# Patient Record
Sex: Female | Born: 1995 | Hispanic: Yes | Marital: Single | Smoking: Former smoker
Health system: Southern US, Community
[De-identification: ages and names within clinical notes are randomized; demographics above are authoritative.]

## PROBLEM LIST (undated history)

## (undated) ENCOUNTER — Inpatient Hospital Stay (HOSPITAL_COMMUNITY): Payer: Self-pay

## (undated) DIAGNOSIS — E119 Type 2 diabetes mellitus without complications: Secondary | ICD-10-CM

## (undated) DIAGNOSIS — A599 Trichomoniasis, unspecified: Secondary | ICD-10-CM

## (undated) DIAGNOSIS — O24419 Gestational diabetes mellitus in pregnancy, unspecified control: Secondary | ICD-10-CM

## (undated) HISTORY — DX: Type 2 diabetes mellitus without complications: E11.9

## (undated) HISTORY — DX: Gestational diabetes mellitus in pregnancy, unspecified control: O24.419

## (undated) HISTORY — PX: NO PAST SURGERIES: SHX2092

---
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
## 2017-01-26 NOTE — L&D Delivery Note (Signed)
Obstetrical Delivery Note   Date of Delivery:   10/03/2017 Primary OB:   [facility] Gestational Age/EDD: [redacted]w[redacted]d  Reason for Admission: IOL for GDMa1 Antepartum complications: GDMa1  Delivered By:   Cornelia Copa MD  Delivery Type:   forceps, low  Delivery Details:   CTSP for severe variable decelerations with contractions. Patient was complete and when I checked her was +2 to +3 for the BPD and felt to LOA with adequate pelvic room and EFW 3300gm. Aminoinfusion already going and had received some terbutaline and the pitocin was turned off but was still contracting frequently with the severe decelerations. Risks, benefits discussed with mom and dad and they were amenable to forceps assistance. Anesthesia bolused her epidural, with peds already present in the room, and Luikart-Simpsons were applied without issue. With next contraction, patient pushed but had issues with pain control but with eventual delivery of the fetal head, which was actually LOP The forceps were disarticulated and the rest of the delivery was normal. The cord was immediately cut and clamped and handed to the pediatricians. No bruising, cuts noted on the newborn. Arterial and venous cord gases were sent but only arterial run: Results for LAQUISTA, SHORB (MRN [id number]) as of 10/04/2017 00:53  Ref. Range 10/03/2017 19:05  pH cord blood (arterial) Latest Ref Range: 7.210 - 7.380  7.294  pCO2 cord blood (arterial) Latest Ref Range: 42.0 - 56.0 mmHg 44.6  Bicarbonate Latest Ref Range: 13.0 - 22.0 mmol/L 21.0   Anesthesia:    epidural Intrapartum complications: severe decelerations late in the 1st and during the 2nd stage of labor GBS:    Positive and she received at least two doses of PCN Laceration:    2nd degree; fixed in the usual fashion with 2-0 vicryl Episiotomy:    none Rectal exam:   Before and after repair Placenta:    Delivered and expressed via active  management. Intact: yes. To pathology: yes.  Delayed Cord Clamping: no Estimated Blood Loss:  . Due to atony. Cytotec PR was placed at the end of the procedure and a dose of Lysteda IV was ordered  Baby:    Liveborn female, APGARs 8/9, weight 2991gm  Cornelia Copa. MD Attending [facility])

## 2017-02-16 ENCOUNTER — Encounter: Payer: Self-pay | Admitting: *Deleted

## 2017-02-16 ENCOUNTER — Encounter: Payer: Self-pay | Admitting: Family Medicine

## 2017-02-16 ENCOUNTER — Ambulatory Visit (INDEPENDENT_AMBULATORY_CARE_PROVIDER_SITE_OTHER): Payer: Medicaid Other | Admitting: *Deleted

## 2017-02-16 DIAGNOSIS — Z3200 Encounter for pregnancy test, result unknown: Secondary | ICD-10-CM

## 2017-02-16 DIAGNOSIS — Z3201 Encounter for pregnancy test, result positive: Secondary | ICD-10-CM

## 2017-02-16 LAB — POCT PREGNANCY, URINE: PREG TEST UR: POSITIVE — AB

## 2017-02-16 NOTE — Progress Notes (Signed)
Chart reviewed for nurse visit. Agree with plan of care.   Judeth HornLawrence, Jamisyn Langer, NP 02/16/2017 12:23 PM

## 2017-02-16 NOTE — Progress Notes (Signed)
Here for pregnancy test which was positive. States LMP 12/26/16 which makes her be 7wk 3 day.States periods are regular and sure of that date.  Not sure where she will go for care. Reviewed meds with her, not taking any. Given letter for proof of pregnancy.

## 2017-03-12 ENCOUNTER — Other Ambulatory Visit (HOSPITAL_COMMUNITY)
Admission: RE | Admit: 2017-03-12 | Discharge: 2017-03-12 | Disposition: A | Discharge status: Home / Self Care | Payer: Medicaid Other | Source: Ambulatory Visit | Attending: Student | Admitting: Student

## 2017-03-12 ENCOUNTER — Encounter: Payer: Self-pay | Admitting: Student

## 2017-03-12 ENCOUNTER — Other Ambulatory Visit: Payer: Self-pay

## 2017-03-12 ENCOUNTER — Ambulatory Visit (INDEPENDENT_AMBULATORY_CARE_PROVIDER_SITE_OTHER): Payer: Medicaid Other | Admitting: Student

## 2017-03-12 VITALS — BP 116/61 | HR 72 | Wt 169.6 lb

## 2017-03-12 DIAGNOSIS — O0990 Supervision of high risk pregnancy, unspecified, unspecified trimester: Secondary | ICD-10-CM

## 2017-03-12 DIAGNOSIS — Z3401 Encounter for supervision of normal first pregnancy, first trimester: Secondary | ICD-10-CM

## 2017-03-12 DIAGNOSIS — Z3400 Encounter for supervision of normal first pregnancy, unspecified trimester: Secondary | ICD-10-CM | POA: Diagnosis present

## 2017-03-12 HISTORY — DX: Supervision of high risk pregnancy, unspecified, unspecified trimester: O09.90

## 2017-03-12 LAB — POCT URINALYSIS DIP (DEVICE)
Bilirubin Urine: NEGATIVE
Glucose, UA: NEGATIVE mg/dL
Ketones, ur: NEGATIVE mg/dL
Nitrite: NEGATIVE
PH: 7 (ref 5.0–8.0)
PROTEIN: 30 mg/dL — AB
Specific Gravity, Urine: 1.02 (ref 1.005–1.030)
Urobilinogen, UA: 0.2 mg/dL (ref 0.0–1.0)

## 2017-03-12 MED ORDER — PRENATAL VITAMINS 0.8 MG PO TABS: 1.0000 | ORAL_TABLET | Freq: Every day | ORAL | 12 refills | Status: DC

## 2017-03-12 NOTE — Patient Instructions (Signed)
Eating Plan for Pregnant Women While you are pregnant, your body will require additional nutrition to help support your growing baby. It is recommended that you consume:  150 additional calories each day during your first trimester.  300 additional calories each day during your second trimester.  300 additional calories each day during your third trimester.  Eating a healthy, well-balanced diet is very important for your health and for your baby's health. You also have a higher need for some vitamins and minerals, such as folic acid, calcium, iron, and vitamin D. What do I need to know about eating during pregnancy?  Do not try to lose weight or go on a diet during pregnancy.  Choose healthy, nutritious foods. Choose  of a sandwich with a glass of milk instead of a candy bar or a high-calorie sugar-sweetened beverage.  Limit your overall intake of foods that have "empty calories." These are foods that have little nutritional value, such as sweets, desserts, candies, sugar-sweetened beverages, and fried foods.  Eat a variety of foods, especially fruits and vegetables.  Take a prenatal vitamin to help meet the additional needs during pregnancy, specifically for folic acid, iron, calcium, and vitamin D.  Remember to stay active. Ask your health care provider for exercise recommendations that are specific to you.  Practice good food safety and cleanliness, such as washing your hands before you eat and after you prepare raw meat. This helps to prevent foodborne illnesses, such as listeriosis, that can be very dangerous for your baby. Ask your health care provider for more information about listeriosis. What does 150 extra calories look like? Healthy options for an additional 150 calories each day could be any of the following:  Plain low-fat yogurt (6-8 oz) with  cup of berries.  1 apple with 2 teaspoons of peanut butter.  Cut-up vegetables with  cup of hummus.  Low-fat chocolate  milk (8 oz or 1 cup).  1 string cheese with 1 medium orange.   of a peanut butter and jelly sandwich on whole-wheat bread (1 tsp of peanut butter).  For 300 calories, you could eat two of those healthy options each day. What is a healthy amount of weight to gain? The recommended amount of weight for you to gain is based on your pre-pregnancy BMI. If your pre-pregnancy BMI was:  Less than 18 (underweight), you should gain 28-40 lb.  18-24.9 (normal), you should gain 25-35 lb.  25-29.9 (overweight), you should gain 15-25 lb.  Greater than 30 (obese), you should gain 11-20 lb.  What if I am having twins or multiples? Generally, pregnant women who will be having twins or multiples may need to increase their daily calories by 300-600 calories each day. The recommended range for total weight gain is 25-54 lb, depending on your pre-pregnancy BMI. Talk with your health care provider for specific guidance about additional nutritional needs, weight gain, and exercise during your pregnancy. What foods can I eat? Grains Any grains. Try to choose whole grains, such as whole-wheat bread, oatmeal, or brown rice. Vegetables Any vegetables. Try to eat a variety of colors and types of vegetables to get a full range of vitamins and minerals. Remember to wash your vegetables well before eating. Fruits Any fruits. Try to eat a variety of colors and types of fruit to get a full range of vitamins and minerals. Remember to wash your fruits well before eating. Meats and Other Protein Sources Lean meats, including chicken, turkey, fish, and lean cuts of beef, veal,   or pork. Make sure that all meats are cooked to "well done." Tofu. Tempeh. Beans. Eggs. Peanut butter and other nut butters. Seafood, such as shrimp, crab, and lobster. If you choose fish, select types that are higher in omega-3 fatty acids, including salmon, herring, mussels, trout, sardines, and pollock. Make sure that all meats are cooked to  food-safe temperatures. Dairy Pasteurized milk and milk alternatives. Pasteurized yogurt and pasteurized cheese. Cottage cheese. Sour cream. Beverages Water. Juices that contain 100% fruit juice or vegetable juice. Caffeine-free teas and decaffeinated coffee. Drinks that contain caffeine are okay to drink, but it is better to avoid caffeine. Keep your total caffeine intake to less than 200 mg each day (12 oz of coffee, tea, or soda) or as directed by your health care provider. Condiments Any pasteurized condiments. Sweets and Desserts Any sweets and desserts. Fats and Oils Any fats and oils. The items listed above may not be a complete list of recommended foods or beverages. Contact your dietitian for more options. What foods are not recommended? Vegetables Unpasteurized (raw) vegetable juices. Fruits Unpasteurized (raw) fruit juices. Meats and Other Protein Sources Cured meats that have nitrates, such as bacon, salami, and hotdogs. Luncheon meats, bologna, or other deli meats (unless they are reheated until they are steaming hot). Refrigerated pate, meat spreads from a meat counter, smoked seafood that is found in the refrigerated section of a store. Raw fish, such as sushi or sashimi. High mercury content fish, such as tilefish, shark, swordfish, and king mackerel. Raw meats, such as tuna or beef tartare. Undercooked meats and poultry. Make sure that all meats are cooked to food-safe temperatures. Dairy Unpasteurized (raw) milk and any foods that have raw milk in them. Soft cheeses, such as feta, queso blanco, queso fresco, Brie, Camembert cheeses, blue-veined cheeses, and Panela cheese (unless it is made with pasteurized milk, which must be stated on the label). Beverages Alcohol. Sugar-sweetened beverages, such as sodas, teas, or energy drinks. Condiments Homemade fermented foods and drinks, such as pickles, sauerkraut, or kombucha drinks. (Store-bought pasteurized versions of these are  okay.) Other Salads that are made in the store, such as ham salad, chicken salad, egg salad, tuna salad, and seafood salad. The items listed above may not be a complete list of foods and beverages to avoid. Contact your dietitian for more information. This information is not intended to replace advice given to you by your health care provider. Make sure you discuss any questions you have with your health care provider. Document Released: 10/27/2013 Document Revised: 06/20/2015 Document Reviewed: 06/27/2013 Elsevier Interactive Patient Education  2018 Elsevier Inc.   

## 2017-03-12 NOTE — Progress Notes (Signed)
Babyscripts optimized

## 2017-03-13 NOTE — Progress Notes (Signed)
  Subjective:    Tamara Lowery is being seen today for her first obstetrical visit.  This is not a planned pregnancy. She is at 2020w0d gestation. Her obstetrical history is significant for nothing. . Relationship with FOB: significant other, not living together. Patient does intend to breast feed. Pregnancy history fully reviewed.  Patient reports no complaints.  Review of Systems:   Review of Systems  Constitutional: Negative.   HENT: Negative.   Respiratory: Negative.   Cardiovascular: Negative.   Gastrointestinal: Negative.   Genitourinary: Negative.   Musculoskeletal: Negative.   Neurological: Negative.   Hematological: Negative.   Psychiatric/Behavioral: Negative.     Objective:     BP 116/61   Pulse 72   Wt 169 lb 9.6 oz (76.9 kg)   LMP 12/26/2016 (Exact Date)   BMI 26.56 kg/m  Physical Exam  Constitutional: She is oriented to person, place, and time. She appears well-developed.  HENT:  Head: Normocephalic.  Neck: Normal range of motion.  Respiratory: Effort normal.  GI: Soft.  Musculoskeletal: Normal range of motion.  Neurological: She is alert and oriented to person, place, and time.  Skin: Skin is warm and dry.  Psychiatric: She has a normal mood and affect.    Exam    Assessment:    Pregnancy: G1P0 Patient Active Problem List   Diagnosis Date Noted  . Supervision of normal first pregnancy, antepartum 03/12/2017       Plan:     Initial labs drawn. Prenatal vitamins. Problem list reviewed and updated. AFP3 discussed: will order quad.  and 1st trimester screen.  Role of ultrasound in pregnancy discussed; fetal survey: will order at next visit. . Amniocentesis discussed: not indicated. Follow up in 2 weeks. 50% of 30 min visit spent on counseling and coordination of care.  [facility] -[facility] not needed   Charlesetta GaribaldiKathryn Lorraine [facility] HospitalKooistra 03/13/2017

## 2017-03-15 ENCOUNTER — Other Ambulatory Visit: Payer: Self-pay | Admitting: Student

## 2017-03-15 LAB — CERVICOVAGINAL ANCILLARY ONLY
Bacterial vaginitis: POSITIVE — AB
CANDIDA VAGINITIS: NEGATIVE
CHLAMYDIA, DNA PROBE: NEGATIVE
Neisseria Gonorrhea: NEGATIVE
TRICH (WINDOWPATH): NEGATIVE

## 2017-03-15 MED ORDER — METRONIDAZOLE 500 MG PO TABS: 500.0000 mg | ORAL_TABLET | Freq: Two times a day (BID) | ORAL | 0 refills | Status: DC

## 2017-03-17 ENCOUNTER — Encounter: Payer: Self-pay | Admitting: *Deleted

## 2017-03-18 ENCOUNTER — Telehealth: Payer: Self-pay | Admitting: *Deleted

## 2017-03-18 ENCOUNTER — Telehealth: Payer: Self-pay | Admitting: Student

## 2017-03-18 NOTE — Telephone Encounter (Signed)
-----   Message from Marylene LandKathryn Lorraine Kooistra, CNM sent at 03/18/2017  5:06 PM EST ----- Please call this patient and let her know about BV and flagyl waiting for her at the pharmacy. I tried to call two times today; no answer. Left one vm on cell phone. Thank you!

## 2017-03-18 NOTE — Telephone Encounter (Signed)
I called Tamara Lowery and notified her of BV and rx sent to pharmacy. She voices understanding.

## 2017-03-18 NOTE — Telephone Encounter (Signed)
Tried to call patient again; no answer at home phone and no answer on cell phone. DId not leave a message. Will send message to [facility] Clinical pool to follow up.  Luna KitchensKathryn Jaquia Benedicto

## 2017-03-18 NOTE — Telephone Encounter (Signed)
Left vmail for patient in Spanish asking her to call clinic to talk about her results. Will try again later today.   Luna KitchensKathryn kooistra

## 2017-03-19 LAB — OBSTETRIC PANEL, INCLUDING HIV
Antibody Screen: NEGATIVE
BASOS ABS: 0 10*3/uL (ref 0.0–0.2)
Basos: 0 %
EOS (ABSOLUTE): 0.1 10*3/uL (ref 0.0–0.4)
Eos: 1 %
HIV Screen 4th Generation wRfx: NONREACTIVE
Hematocrit: 36.8 % (ref 34.0–46.6)
Hemoglobin: 11.9 g/dL (ref 11.1–15.9)
Hepatitis B Surface Ag: NEGATIVE
Immature Grans (Abs): 0 10*3/uL (ref 0.0–0.1)
Immature Granulocytes: 0 %
LYMPHS ABS: 2.1 10*3/uL (ref 0.7–3.1)
Lymphs: 26 %
MCH: 31.5 pg (ref 26.6–33.0)
MCHC: 32.3 g/dL (ref 31.5–35.7)
MCV: 97 fL (ref 79–97)
MONOCYTES: 6 %
Monocytes Absolute: 0.5 10*3/uL (ref 0.1–0.9)
NEUTROS ABS: 5.3 10*3/uL (ref 1.4–7.0)
Neutrophils: 67 %
PLATELETS: 335 10*3/uL (ref 150–379)
RBC: 3.78 x10E6/uL (ref 3.77–5.28)
RDW: 13.2 % (ref 12.3–15.4)
RPR Ser Ql: NONREACTIVE
Rh Factor: POSITIVE
Rubella Antibodies, IGG: 2.59 index (ref >0.99)
WBC: 8.1 10*3/uL (ref 3.4–10.8)

## 2017-03-19 LAB — HEMOGLOBINOPATHY EVALUATION
Ferritin: 152 ng/mL — ABNORMAL HIGH (ref 15–150)
Hgb A2 Quant: 1.7 % — ABNORMAL LOW (ref 1.8–3.2)
Hgb A: 98.3 % (ref 96.4–98.8)
Hgb C: 0 %
Hgb F Quant: 0 % (ref 0.0–2.0)
Hgb S: 0 %
Hgb Solubility: NEGATIVE
Hgb Variant: 0 %

## 2017-03-19 LAB — CYSTIC FIBROSIS GENE TEST

## 2017-03-20 LAB — URINE CULTURE, OB REFLEX

## 2017-03-20 LAB — CULTURE, OB URINE

## 2017-03-22 LAB — SMN1 COPY NUMBER ANALYSIS (SMA CARRIER SCREENING)

## 2017-03-24 ENCOUNTER — Encounter: Payer: Self-pay | Admitting: Student

## 2017-03-24 DIAGNOSIS — D649 Anemia, unspecified: Secondary | ICD-10-CM | POA: Insufficient documentation

## 2017-03-24 HISTORY — DX: Anemia, unspecified: D64.9

## 2017-03-26 ENCOUNTER — Encounter: Payer: Medicaid Other | Admitting: Obstetrics and Gynecology

## 2017-03-29 ENCOUNTER — Other Ambulatory Visit: Payer: Self-pay | Admitting: Student

## 2017-03-29 ENCOUNTER — Encounter (HOSPITAL_COMMUNITY): Payer: Self-pay

## 2017-03-29 ENCOUNTER — Ambulatory Visit (HOSPITAL_COMMUNITY)
Admission: RE | Admit: 2017-03-29 | Discharge: 2017-03-29 | Disposition: A | Discharge status: Home / Self Care | Payer: Medicaid Other | Source: Ambulatory Visit | Attending: Student | Admitting: Student

## 2017-03-29 DIAGNOSIS — Z3400 Encounter for supervision of normal first pregnancy, unspecified trimester: Secondary | ICD-10-CM

## 2017-03-29 DIAGNOSIS — Z3A13 13 weeks gestation of pregnancy: Secondary | ICD-10-CM | POA: Diagnosis not present

## 2017-03-29 DIAGNOSIS — Z3682 Encounter for antenatal screening for nuchal translucency: Secondary | ICD-10-CM

## 2017-03-29 HISTORY — DX: Trichomoniasis, unspecified: A59.9

## 2017-03-29 NOTE — Addendum Note (Signed)
Encounter addended by: Lenise ArenaBazemore, Mercades Bajaj, RDMS on: 03/29/2017 10:04 AM  Actions taken: Imaging Exam ended

## 2017-04-01 ENCOUNTER — Other Ambulatory Visit: Payer: Self-pay

## 2017-04-13 ENCOUNTER — Encounter: Payer: Medicaid Other | Admitting: Family Medicine

## 2017-04-14 ENCOUNTER — Ambulatory Visit (INDEPENDENT_AMBULATORY_CARE_PROVIDER_SITE_OTHER): Payer: Medicaid Other | Admitting: Family Medicine

## 2017-04-14 VITALS — BP 126/79 | HR 83 | Wt 173.5 lb

## 2017-04-14 DIAGNOSIS — D649 Anemia, unspecified: Secondary | ICD-10-CM

## 2017-04-14 DIAGNOSIS — Z3689 Encounter for other specified antenatal screening: Secondary | ICD-10-CM

## 2017-04-14 DIAGNOSIS — Z3400 Encounter for supervision of normal first pregnancy, unspecified trimester: Secondary | ICD-10-CM

## 2017-04-14 NOTE — Progress Notes (Signed)
   PRENATAL VISIT NOTE  Subjective:  Tamara Lowery is a 22 y.o. G1P0 at 5164w4d being seen today for ongoing prenatal care.  She is currently monitored for the following issues for this low-risk pregnancy and has Supervision of normal first pregnancy, antepartum and Low blood hemoglobin A2 on their problem list.  Patient reports no complaints.   . Vag. Bleeding: None.  Movement: Present. Denies leaking of fluid.   The following portions of the patient's history were reviewed and updated as appropriate: allergies, current medications, past family history, past medical history, past social history, past surgical history and problem list. Problem list updated.  Objective:   Vitals:   04/14/17 1508  BP: 126/79  Pulse: 83  Weight: 173 lb 8 oz (78.7 kg)    Fetal Status: Fetal Heart Rate (bpm): 142   Movement: Present     General:  Alert, oriented and cooperative. Patient is in no acute distress.  Skin: Skin is warm and dry. No rash noted.   Cardiovascular: Normal heart rate noted  Respiratory: Normal respiratory effort, no problems with respiration noted  Abdomen: Soft, gravid, appropriate for gestational age.  Pain/Pressure: Absent     Pelvic: Cervical exam deferred        Extremities: Normal range of motion.  Edema: None  Mental Status:  Normal mood and affect. Normal behavior. Normal judgment and thought content.   Assessment and Plan:  Pregnancy: G1P0 at 2564w4d   1. Screening, antenatal, for fetal anatomic survey  - US MFM OB DETAIL +14 WK; Future  2. Supervision of normal first pregnancy, antepartum  3. Low blood hemoglobin A2 Order panorama   Please refer to After Visit Summary for other counseling recommendations.  Return in about 4 weeks (around 05/12/2017).   Rolm BookbinderAmber Jobe Mutch, DO

## 2017-04-20 ENCOUNTER — Encounter: Payer: Self-pay | Admitting: *Deleted

## 2017-05-06 ENCOUNTER — Other Ambulatory Visit: Payer: Self-pay | Admitting: Family Medicine

## 2017-05-06 ENCOUNTER — Ambulatory Visit (HOSPITAL_COMMUNITY)
Admission: RE | Admit: 2017-05-06 | Discharge: 2017-05-06 | Disposition: A | Discharge status: Home / Self Care | Payer: Medicaid Other | Source: Ambulatory Visit | Attending: Family Medicine | Admitting: Family Medicine

## 2017-05-06 DIAGNOSIS — Z3A18 18 weeks gestation of pregnancy: Secondary | ICD-10-CM

## 2017-05-06 DIAGNOSIS — Z363 Encounter for antenatal screening for malformations: Secondary | ICD-10-CM | POA: Insufficient documentation

## 2017-05-06 DIAGNOSIS — Z3689 Encounter for other specified antenatal screening: Secondary | ICD-10-CM

## 2017-05-06 DIAGNOSIS — D649 Anemia, unspecified: Secondary | ICD-10-CM

## 2017-05-06 DIAGNOSIS — O2692 Pregnancy related conditions, unspecified, second trimester: Secondary | ICD-10-CM

## 2017-05-12 ENCOUNTER — Ambulatory Visit (INDEPENDENT_AMBULATORY_CARE_PROVIDER_SITE_OTHER): Payer: Medicaid Other | Admitting: Family Medicine

## 2017-05-12 VITALS — BP 106/60 | HR 88 | Wt 189.0 lb

## 2017-05-12 DIAGNOSIS — Z3400 Encounter for supervision of normal first pregnancy, unspecified trimester: Secondary | ICD-10-CM

## 2017-05-12 DIAGNOSIS — Z3402 Encounter for supervision of normal first pregnancy, second trimester: Secondary | ICD-10-CM

## 2017-05-12 DIAGNOSIS — D649 Anemia, unspecified: Secondary | ICD-10-CM

## 2017-05-12 NOTE — Progress Notes (Signed)
   PRENATAL VISIT NOTE  Subjective:  Tamara Lowery is a 22 y.o. G1P0 at 8144w4d being seen today for ongoing prenatal care.  She is currently monitored for the following issues for this low-risk pregnancy and has Supervision of normal first pregnancy, antepartum and Low blood hemoglobin A2 on their problem list.  Patient reports no complaints.  Contractions: Not present. Vag. Bleeding: None.  Movement: Present. Denies leaking of fluid.   The following portions of the patient's history were reviewed and updated as appropriate: allergies, current medications, past family history, past medical history, past social history, past surgical history and problem list. Problem list updated.  Objective:   Vitals:   05/12/17 1401  BP: 106/60  Pulse: 88  Weight: 189 lb (85.7 kg)    Fetal Status: Fetal Heart Rate (bpm): 141   Movement: Present     General:  Alert, oriented and cooperative. Patient is in no acute distress.  Skin: Skin is warm and dry. No rash noted.   Cardiovascular: Normal heart rate noted  Respiratory: Normal respiratory effort, no problems with respiration noted  Abdomen: Soft, gravid, appropriate for gestational age.  Pain/Pressure: Absent     Pelvic: Cervical exam deferred        Extremities: Normal range of motion.     Mental Status: Normal mood and affect. Normal behavior. Normal judgment and thought content.   Assessment and Plan:  Pregnancy: G1P0 at 3844w4d  1. Supervision of normal first pregnancy, antepartum - Routine prenatal care - Schedule follow up US to complete anatomy scan - [place] MFM OB FOLLOW UP; Future  Preterm labor symptoms and general obstetric precautions including but not limited to vaginal bleeding, contractions, leaking of fluid and fetal movement were reviewed in detail with the patient. Please refer to After Visit Summary for other counseling recommendations.  Return in about 1 month (around 06/09/2017) for LOB.  Future Appointments  Date  Time Provider Department Center  06/02/2017  2:45 PM [facility] US 5 [facility]-US  06/09/2017  1:15 PM Rolm BookbinderNeill, Caroline M, CNM [facility]  07/14/2017  8:20 AM [facility] LAB [facility]  07/14/2017  8:55 AM Judeth HornLawrence, Erin, NP [facility]    Frederik PearJulie P Furman Trentman, MD

## 2017-05-12 NOTE — Patient Instructions (Signed)
Second Trimester of Pregnancy The second trimester is from week 13 through week 28, month 4 through 6. This is often the time in pregnancy that you feel your best. Often times, morning sickness has lessened or quit. You may have more energy, and you may get hungry more often. Your unborn baby (fetus) is growing rapidly. At the end of the sixth month, he or she is about 9 inches long and weighs about 1 pounds. You will likely feel the baby move (quickening) between 18 and 20 weeks of pregnancy. Follow these instructions at home:  Avoid all smoking, herbs, and alcohol. Avoid drugs not approved by your doctor.  Do not use any tobacco products, including cigarettes, chewing tobacco, and electronic cigarettes. If you need help quitting, ask your doctor. You may get counseling or other support to help you quit.  Only take medicine as told by your doctor. Some medicines are safe and some are not during pregnancy.  Exercise only as told by your doctor. Stop exercising if you start having cramps.  Eat regular, healthy meals.  Wear a good support bra if your breasts are tender.  Do not use hot tubs, steam rooms, or saunas.  Wear your seat belt when driving.  Avoid raw meat, uncooked cheese, and liter boxes and soil used by cats.  Take your prenatal vitamins.  Take 1500-2000 milligrams of calcium daily starting at the 20th week of pregnancy until you deliver your baby.  Try taking medicine that helps you poop (stool softener) as needed, and if your doctor approves. Eat more fiber by eating fresh fruit, vegetables, and whole grains. Drink enough fluids to keep your pee (urine) clear or pale yellow.  Take warm water baths (sitz baths) to soothe pain or discomfort caused by hemorrhoids. Use hemorrhoid cream if your doctor approves.  If you have puffy, bulging veins (varicose veins), wear support hose. Raise (elevate) your feet for 15 minutes, 3-4 times a day. Limit salt in your diet.  Avoid heavy  lifting, wear low heals, and sit up straight.  Rest with your legs raised if you have leg cramps or low back pain.  Visit your dentist if you have not gone during your pregnancy. Use a soft toothbrush to brush your teeth. Be gentle when you floss.  You can have sex (intercourse) unless your doctor tells you not to.  Go to your doctor visits. Get help if:  You feel dizzy.  You have mild cramps or pressure in your lower belly (abdomen).  You have a nagging pain in your belly area.  You continue to feel sick to your stomach (nauseous), throw up (vomit), or have watery poop (diarrhea).  You have bad smelling fluid coming from your vagina.  You have pain with peeing (urination). Get help right away if:  You have a fever.  You are leaking fluid from your vagina.  You have spotting or bleeding from your vagina.  You have severe belly cramping or pain.  You lose or gain weight rapidly.  You have trouble catching your breath and have chest pain.  You notice sudden or extreme puffiness (swelling) of your face, hands, ankles, feet, or legs.  You have not felt the baby move in over an hour.  You have severe headaches that do not go away with medicine.  You have vision changes. This information is not intended to replace advice given to you by your health care provider. Make sure you discuss any questions you have with your health care   provider. Document Released: 04/08/2009 Document Revised: 06/20/2015 Document Reviewed: 03/15/2012 Elsevier Interactive Patient Education  2017 [employer]  

## 2017-06-02 ENCOUNTER — Ambulatory Visit (HOSPITAL_COMMUNITY)
Admission: RE | Admit: 2017-06-02 | Discharge: 2017-06-02 | Disposition: A | Discharge status: Home / Self Care | Payer: Medicaid Other | Source: Ambulatory Visit | Attending: Family Medicine | Admitting: Family Medicine

## 2017-06-02 DIAGNOSIS — Z362 Encounter for other antenatal screening follow-up: Secondary | ICD-10-CM | POA: Insufficient documentation

## 2017-06-02 DIAGNOSIS — Z3A22 22 weeks gestation of pregnancy: Secondary | ICD-10-CM | POA: Diagnosis not present

## 2017-06-02 DIAGNOSIS — O321XX0 Maternal care for breech presentation, not applicable or unspecified: Secondary | ICD-10-CM | POA: Insufficient documentation

## 2017-06-02 DIAGNOSIS — D649 Anemia, unspecified: Secondary | ICD-10-CM | POA: Diagnosis not present

## 2017-06-02 DIAGNOSIS — O99012 Anemia complicating pregnancy, second trimester: Secondary | ICD-10-CM | POA: Insufficient documentation

## 2017-06-02 DIAGNOSIS — Z3400 Encounter for supervision of normal first pregnancy, unspecified trimester: Secondary | ICD-10-CM

## 2017-06-09 ENCOUNTER — Ambulatory Visit (INDEPENDENT_AMBULATORY_CARE_PROVIDER_SITE_OTHER): Payer: Medicaid Other

## 2017-06-09 VITALS — BP 115/69 | HR 84 | Wt 207.8 lb

## 2017-06-09 DIAGNOSIS — Z3400 Encounter for supervision of normal first pregnancy, unspecified trimester: Secondary | ICD-10-CM

## 2017-06-09 NOTE — Progress Notes (Signed)
   PRENATAL VISIT NOTE  Subjective:  Tamara Lowery is a 22 y.o. G1P0 at [redacted]w[redacted]d being seen today for ongoing prenatal care.  She is currently monitored for the following issues for this low-risk pregnancy and has Supervision of normal first pregnancy, antepartum and Low blood hemoglobin A2 on their problem list.  Patient reports no complaints.  Contractions: Not present. Vag. Bleeding: None.  Movement: Present. Denies leaking of fluid.   The following portions of the patient's history were reviewed and updated as appropriate: allergies, current medications, past family history, past medical history, past social history, past surgical history and problem list. Problem list updated.  Objective:   Vitals:   06/09/17 1316  BP: 115/69  Pulse: 84  Weight: 207 lb 12.8 oz (94.3 kg)    Fetal Status: Fetal Heart Rate (bpm): 138 Fundal Height: 24 cm Movement: Present     General:  Alert, oriented and cooperative. Patient is in no acute distress.  Skin: Skin is warm and dry. No rash noted.   Cardiovascular: Normal heart rate noted  Respiratory: Normal respiratory effort, no problems with respiration noted  Abdomen: Soft, gravid, appropriate for gestational age.  Pain/Pressure: Absent     Pelvic: Cervical exam deferred        Extremities: Normal range of motion.  Edema: Trace  Mental Status: Normal mood and affect. Normal behavior. Normal judgment and thought content.   Assessment and Plan:  Pregnancy: G1P0 at [redacted]w[redacted]d  1. Supervision of normal first pregnancy, antepartum - No complaints. Routine care  - Follow up ultrasound for growth ordered per MFM - [place] MFM OB FOLLOW UP; Future  Preterm labor symptoms and general obstetric precautions including but not limited to vaginal bleeding, contractions, leaking of fluid and fetal movement were reviewed in detail with the patient. Please refer to After Visit Summary for other counseling recommendations.  Return in about 1 month (around  07/07/2017) for 2hr GTT and labs, Return OB visit.  Future Appointments  Date Time Provider Department Center  07/14/2017  8:20 AM [facility] LAB [facility]  07/14/2017  8:55 AM Judeth Horn, NP [facility]  07/14/2017  1:45 PM [facility] [place] 2 [facility] M[facility]-US    Rolm Bookbinder, CNM  06/09/17 1:27 PM

## 2017-06-09 NOTE — Patient Instructions (Signed)
Glucose Tolerance Test During Pregnancy The glucose tolerance test (GTT) is a blood test used to determine if you have developed a type of diabetes during pregnancy (gestational diabetes). This is when your body does not properly process sugar (glucose) in the food you eat, resulting in high blood glucose levels. Typically, a GTT is done after you have had a 1-hour glucose test with results that indicate you possibly have gestational diabetes. It may also be done if:  You have a history of giving birth to very large babies or have experienced repeated fetal loss (stillbirth).  You have signs and symptoms of diabetes, such as: ? Changes in your vision. ? Tingling or numbness in your hands or feet. ? Changes in hunger, thirst, and urination not otherwise explained by your pregnancy.  The GTT lasts about 3 hours. You will be given a sugar-water solution to drink at the beginning of the test. You will have blood drawn before you drink the solution and then again 1, 2, and 3 hours after you drink it. You will not be allowed to eat or drink anything else during the test. You must remain at the testing location to make sure that your blood is drawn on time. You should also avoid exercising during the test, because exercise can alter test results. How do I prepare for this test? Eat normally for 3 days prior to the GTT test, including having plenty of carbohydrate-rich foods. Do not eat or drink anything except water during the final 12 hours before the test. In addition, your health care provider may ask you to stop taking certain medicines before the test. What do the results mean? It is your responsibility to obtain your test results. Ask the lab or department performing the test when and how you will get your results. Contact your health care provider to discuss any questions you have about your results. Range of Normal Values Ranges for normal values may vary among different labs and hospitals. You  should always check with your health care provider after having lab work or other tests done to discuss whether your values are considered within normal limits. Normal levels of blood glucose are as follows:  Fasting: less than 105 mg/dL.  1 hour after drinking the solution: less than 190 mg/dL.  2 hours after drinking the solution: less than 165 mg/dL.  3 hours after drinking the solution: less than 145 mg/dL.  Some substances can interfere with GTT results. These may include:  Blood pressure and heart failure medicines, including beta blockers, furosemide, and thiazides.  Anti-inflammatory medicines, including aspirin.  Nicotine.  Some psychiatric medicines.  Meaning of Results Outside Normal Value Ranges GTT test results that are above normal values may indicate a number of health problems, such as:  Gestational diabetes.  Acute stress response.  Cushing syndrome.  Tumors such as pheochromocytoma or glucagonoma.  Long-term kidney problems.  Pancreatitis.  Hyperthyroidism.  Current infection.  Discuss your test results with your health care provider. He or she will use the results to make a diagnosis and determine a treatment plan that is right for you. This information is not intended to replace advice given to you by your health care provider. Make sure you discuss any questions you have with your health care provider. Document Released: 07/14/2011 Document Revised: 06/20/2015 Document Reviewed: 05/19/2013 Elsevier Interactive Patient Education  2018 [employer]  Safe Medications in Pregnancy   Acne: Benzoyl Peroxide Salicylic Acid  Backache/Headache: Tylenol: 2 regular strength every 4 hours   OR              2 Extra strength every 6 hours  Colds/Coughs/Allergies: Benadryl (alcohol free) 25 mg every 6 hours as needed Breath right strips Claritin Cepacol throat lozenges Chloraseptic throat spray Cold-Eeze- up to three times per day Cough drops,  alcohol free Flonase (by prescription only) Guaifenesin Mucinex Robitussin DM (plain only, alcohol free) Saline nasal spray/drops Sudafed (pseudoephedrine) & Actifed ** use only after [redacted] weeks gestation and if you do not have high blood pressure Tylenol Vicks Vaporub Zinc lozenges Zyrtec   Constipation: Colace Ducolax suppositories Fleet enema Glycerin suppositories Metamucil Milk of magnesia Miralax Senokot Smooth move tea  Diarrhea: Kaopectate Imodium A-D  *NO pepto Bismol  Hemorrhoids: Anusol Anusol HC Preparation H Tucks  Indigestion: Tums Maalox Mylanta Zantac  Pepcid  Insomnia: Benadryl (alcohol free) 25mg every 6 hours as needed Tylenol PM Unisom, no Gelcaps  Leg Cramps: Tums MagGel  Nausea/Vomiting:  Bonine Dramamine Emetrol Ginger extract Sea bands Meclizine  Nausea medication to take during pregnancy:  Unisom (doxylamine succinate 25 mg tablets) Take one tablet daily at bedtime. If symptoms are not adequately controlled, the dose can be increased to a maximum recommended dose of two tablets daily (1/2 tablet in the morning, 1/2 tablet mid-afternoon and one at bedtime). Vitamin B6 100mg tablets. Take one tablet twice a day (up to 200 mg per day).  Skin Rashes: Aveeno products Benadryl cream or 25mg every 6 hours as needed Calamine Lotion 1% cortisone cream  Yeast infection: Gyne-lotrimin 7 Monistat 7   **If taking multiple medications, please check labels to avoid duplicating the same active ingredients **take medication as directed on the label ** Do not exceed 4000 mg of tylenol in 24 hours **Do not take medications that contain aspirin or ibuprofen     

## 2017-07-14 ENCOUNTER — Other Ambulatory Visit: Payer: Medicaid Other

## 2017-07-14 ENCOUNTER — Ambulatory Visit (HOSPITAL_COMMUNITY)
Admission: RE | Admit: 2017-07-14 | Discharge: 2017-07-14 | Disposition: A | Discharge status: Home / Self Care | Payer: Medicaid Other | Source: Ambulatory Visit

## 2017-07-14 ENCOUNTER — Other Ambulatory Visit: Payer: Self-pay

## 2017-07-14 ENCOUNTER — Ambulatory Visit (INDEPENDENT_AMBULATORY_CARE_PROVIDER_SITE_OTHER): Payer: Medicaid Other | Admitting: Nurse Practitioner

## 2017-07-14 VITALS — BP 126/59 | HR 71 | Wt 220.0 lb

## 2017-07-14 DIAGNOSIS — Z362 Encounter for other antenatal screening follow-up: Secondary | ICD-10-CM

## 2017-07-14 DIAGNOSIS — Z3A28 28 weeks gestation of pregnancy: Secondary | ICD-10-CM

## 2017-07-14 DIAGNOSIS — Z3400 Encounter for supervision of normal first pregnancy, unspecified trimester: Secondary | ICD-10-CM

## 2017-07-14 DIAGNOSIS — Z3689 Encounter for other specified antenatal screening: Secondary | ICD-10-CM | POA: Insufficient documentation

## 2017-07-14 DIAGNOSIS — O2690 Pregnancy related conditions, unspecified, unspecified trimester: Secondary | ICD-10-CM

## 2017-07-14 DIAGNOSIS — O99013 Anemia complicating pregnancy, third trimester: Secondary | ICD-10-CM | POA: Insufficient documentation

## 2017-07-14 DIAGNOSIS — O2693 Pregnancy related conditions, unspecified, third trimester: Secondary | ICD-10-CM | POA: Diagnosis not present

## 2017-07-14 DIAGNOSIS — Z23 Encounter for immunization: Secondary | ICD-10-CM

## 2017-07-14 DIAGNOSIS — R635 Abnormal weight gain: Secondary | ICD-10-CM | POA: Insufficient documentation

## 2017-07-14 DIAGNOSIS — O321XX0 Maternal care for breech presentation, not applicable or unspecified: Secondary | ICD-10-CM | POA: Diagnosis not present

## 2017-07-14 DIAGNOSIS — Z3403 Encounter for supervision of normal first pregnancy, third trimester: Secondary | ICD-10-CM

## 2017-07-14 HISTORY — DX: Abnormal weight gain: R63.5

## 2017-07-14 MED ORDER — PRENATA 29-1 MG PO CHEW: 1.0000 | CHEWABLE_TABLET | Freq: Every day | ORAL | 11 refills | Status: DC

## 2017-07-14 NOTE — Progress Notes (Signed)
Patient reports itching of the eyes for the past week- denies allergies

## 2017-07-14 NOTE — Patient Instructions (Addendum)
Allergy eye drops or claritin or zyrtec   AREA PEDIATRIC/FAMILY PRACTICE PHYSICIANS  [facility] [address] [place], [place]  [address] Phone - [phone]   Fax - [phone]  [facility] [address] [place], [place] [address] Phone - [phone]   Fax - [phone]  JACK AMOS [address] [place], [place]  [address] Phone - [phone]   Fax - [phone]  [facility] [address] [place], [place]  [address] Phone - [phone]   Fax - [phone]  [facility] [address] [place], [place]  [address] Phone - [phone]   Fax - [phone]  [facility] [address] [place], [place]  [address] Phone - [phone]   Fax - [phone]  [facility] [address] [place], [place]  [address] Phone - [phone]   Fax - [phone]  [facility] [address] [place], [address] [place], [place]  [address] Phone - [phone]   Fax - [phone]  [facility] AT [facility] [address] [place], [place]  [address] Phone - [phone]   Fax - [phone] [facility] [place] [address] [place], [place]  [address] Phone - [phone]   Fax - [phone]  [facility] [address] [place], [place]  [address] Phone - [phone]   Fax - [phone]  [facility] [address], [address] [place], [place]  [address] Phone - [phone]   Fax - [phone]  [facility] [place] [address] [place], [place]  [address] Phone - [phone]   Fax - [phone]  [place] [facility] [address] [place], [place]  [address] Phone - [phone]  [facility] [address] [place], [place]  [address] Phone - [phone]   Fax -  [phone]  [facility] [address] [place], [place]  [address] Phone - [phone]   Fax - [phone]  [facility] [address] [place], [place]  [address] Phone - [phone]   Fax - [phone]  [facility] [address] [place], [place]  [address] Phone - [phone]   Fax - [phone]   [facility] [address] [place], [place]  [address] Phone - [phone]   Fax - [phone]  [facility] [address] [place], [place]  [address] Phone - [phone]   Fax - [phone]  DAVID RUBIN [address] [place], [place]  [address] Phone - [phone]   Fax - [phone]  [facility] [address] [place], [place]  [address] Phone - [phone]   Fax - [phone]  [place] [facility] Alita Chyle [address] [place], [place]  [address] Phone - [phone]   Fax - [phone] Gerarda Fraction [address] [place], [place]  [address] Phone - [phone]   Fax - [phone]  [facility] [address] [place], [place]  [address] Phone - [phone]   Fax - [phone]  [facility] [place] [address] [place], [place]  [address] Phone - [phone]   Fax - [phone]  [facility] [place] Wyvonne Lenz MD [address] [place] [address] Phone [phone]  Fax [phone]  BENEFITS OF BREASTFEEDING Many women wonder  if they should breastfeed. Research shows that breast milk contains the perfect balance of vitamins, protein and fat that your baby needs to grow. It also contains antibodies that help your baby's immune system to fight off viruses and bacteria and can reduce the risk of sudden infant death syndrome (SIDS). In addition, the colostrum (a fluid secreted from the breast in the first few days  after delivery) helps your newborn's digestive system to grow and function well. Breast milk is easier to digest than formula. Also, if your baby is born preterm, breast milk can help to reduce both short- and long-term health problems. BENEFITS OF BREASTFEEDING FOR MOM . Breastfeeding causes a hormone to be released that helps the uterus to contract and return to its normal size more quickly. . It aids in postpartum weight loss, reduces risk of breast and ovarian cancer, heart disease and rheumatoid arthritis. . It decreases the amount of bleeding after the baby is born. benefits of breastfeeding for baby . Provides comfort and nutrition . Protects baby against - Obesity - Diabetes - Asthma - Childhood cancers - Heart disease - Ear infections - Diarrhea - Pneumonia - Stomach problems - Serious allergies - Skin rashes . Promotes growth and development . Reduces the risk of baby having Sudden Infant Death Syndrome (SIDS) only breastmilk for the first 6 months . Protects baby against diseases/allergies . It's the perfect amount for tiny bellies . It restores baby's energy . Provides the best nutrition for baby . Giving water or formula can make baby more likely to get sick, decrease Mom's milk supply, make baby less content with breastfeeding Skin to Skin After delivery, the staff will place your baby on your chest. This helps with the following: . Regulates baby's temperature, breathing, heart rate and blood sugar . Increases Mom's milk supply . Promotes bonding . Keeps baby and Mom calm and decreases baby's crying Rooming In Your baby will stay in your room with you for the entire time you are in the hospital. This helps with the following: . Allows Mom to learn baby's feeding cues - Fluttering eyes - Sucking on tongue or hand - Rooting (opens mouth and turns head) - Nuzzling into the breast - Bringing hand to mouth . Allows breastfeeding on demand (when your baby is  ready) . Helps baby to be calm and content . Ensures a good milk supply . Prevents complications with breastfeeding . Allows parents to learn to care for baby . Allows you to request assistance with breastfeeding Importance of a good latch . Increases milk transfer to baby - baby gets enough milk . Ensures you have enough milk for your baby . Decreases nipple soreness . Don't use pacifiers and bottles - these cause baby to suck differently than breastfeeding . Promotes continuation of breastfeeding Risks of Formula Supplementation with Breastfeeding Giving your infant formula in addition to your breast-milk EXCEPT when medically necessary can lead to: Marland Kitchen Decreases your milk supply  . Loss of confidence in yourself for providing baby's nutrition  . Engorgement and possibly mastitis  . Asthma & allergies in the baby BREASTFEEDING FAQS How long should I breastfeed my baby? It is recommended that you provide your baby with breast milk only for the first 6 months and then continue for the first year and longer as desired. During the first few weeks after birth, your baby will need to feed 8-12 times every 24 hours, or every 2-3 hours. They will likely feed for 15-30 minutes. How can  I help my baby begin breastfeeding? Babies are born with an instinct to breastfeed. A healthy baby can begin breastfeeding right away without specific help. At the hospital, a nurse (or lactation consultant) will help you begin the process and will give you tips on good positioning. It may be helpful to take a breastfeeding class before you deliver in order to know what to expect. How can I help my baby latch on? In order to assist your baby in latching-on, cup your breast in your hand and stroke your baby's lower lip with your nipple to stimulate your baby's rooting reflex. Your baby will look like he or she is yawning, at which point you should bring the baby towards your breast, while aiming the nipple at the roof of  his or her mouth. Remember to bring the baby towards you and not your breast towards the baby. How can I tell if my baby is latched-on? Your baby will have all of your nipple and part of the dark area around the nipple in his or her mouth and your baby's nose will be touching your breast. You should see or hear the baby swallowing. If the baby is not latched-on properly, start the process over. To remove the suction, insert a clean finger between your breast and the baby's mouth. Should I switch breasts during feeding? After feeding on one side, switch the baby to your other breast. If he or she does not continue feeding - that is OK. Your baby will not necessarily need to feed from both breasts in a single feeding. On the next feeding, start with the other breast for efficiency and comfort. How can I tell if my baby is hungry? When your baby is hungry, they will nuzzle against your breast, make sucking noises and tongue motions and may put their hands near their mouth. Crying is a late sign of hunger, so you should not wait until this point. When they have received enough milk, they will unlatch from the breast. Is it okay to use a pacifier? Until your baby gets the hang of breastfeeding, experts recommend limiting pacifier usage. If you have questions about this, please contact your pediatrician. What can I do to ensure proper nutrition while breastfeeding? . Make sure that you support your own health and your baby's by eating a healthy, well-balanced diet . Your provider may recommend that you continue to take your prenatal vitamin . Drink plenty of fluids. It is a good rule to drink one glass of water before or after feeding . Alcohol will remain in the breast milk for as long as it will remain in the blood stream. If you choose to have a drink, it is recommended that you wait at least 2 hours before feeding . Moderate amounts of caffeine are OK . Some over-the-counter or prescription medications  are not recommended during breastfeeding. Check with your provider if you have questions What types of birth control methods are safe while breastfeeding? Progestin-only methods, including a daily pill, an IUD, the implant and the injection are safe while breastfeeding. Methods that contain estrogen (such as combination birth control pills, the vaginal ring and the patch) should not be used during the first month of breastfeeding as these can decrease your milk supply.  Tdap Vaccine (Tetanus, Diphtheria and Pertussis): What You Need to Know 1. Why get vaccinated? Tetanus, diphtheria and pertussis are very serious diseases. Tdap vaccine can protect us from these diseases. And, Tdap vaccine given to pregnant women can  protect newborn babies against pertussis. TETANUS (Lockjaw) is rare in the [place] today. It causes painful muscle tightening and stiffness, usually all over the body.  It can lead to tightening of muscles in the head and neck so you can't open your mouth, swallow, or sometimes even breathe. Tetanus kills about 1 out of 10 people who are infected even after receiving the best medical care.  DIPHTHERIA is also rare in the [place] today. It can cause a thick coating to form in the back of the throat.  It can lead to breathing problems, heart failure, paralysis, and death.  PERTUSSIS (Whooping Cough) causes severe coughing spells, which can cause difficulty breathing, vomiting and disturbed sleep.  It can also lead to weight loss, incontinence, and rib fractures. Up to 2 in 100 adolescents and 5 in 100 adults with pertussis are hospitalized or have complications, which could include pneumonia or death.  These diseases are caused by bacteria. Diphtheria and pertussis are spread from person to person through secretions from coughing or sneezing. Tetanus enters the body through cuts, scratches, or wounds. Before vaccines, as many as 200,000 cases of diphtheria, 200,000 cases of  pertussis, and hundreds of cases of tetanus, were reported in the [place] each year. Since vaccination began, reports of cases for tetanus and diphtheria have dropped by about 99% and for pertussis by about 80%. 2. Tdap vaccine Tdap vaccine can protect adolescents and adults from tetanus, diphtheria, and pertussis. One dose of Tdap is routinely given at age 33 or 75. People who did not get Tdap at that age should get it as soon as possible. Tdap is especially important for healthcare professionals and anyone having close contact with a baby younger than 12 months. Pregnant women should get a dose of Tdap during every pregnancy, to protect the newborn from pertussis. Infants are most at risk for severe, life-threatening complications from pertussis. Another vaccine, called Td, protects against tetanus and diphtheria, but not pertussis. A Td booster should be given every 10 years. Tdap may be given as one of these boosters if you have never gotten Tdap before. Tdap may also be given after a severe cut or burn to prevent tetanus infection. Your doctor or the person giving you the vaccine can give you more information. Tdap may safely be given at the same time as other vaccines. 3. Some people should not get this vaccine  A person who has ever had a life-threatening allergic reaction after a previous dose of any diphtheria, tetanus or pertussis containing vaccine, OR has a severe allergy to any part of this vaccine, should not get Tdap vaccine. Tell the person giving the vaccine about any severe allergies.  Anyone who had coma or long repeated seizures within 7 days after a childhood dose of DTP or DTaP, or a previous dose of Tdap, should not get Tdap, unless a cause other than the vaccine was found. They can still get Td.  Talk to your doctor if you: ? have seizures or another nervous system problem, ? had severe pain or swelling after any vaccine containing diphtheria, tetanus or  pertussis, ? ever had a condition called Guillain-Barr Syndrome (GBS), ? aren't feeling well on the day the shot is scheduled. 4. Risks With any medicine, including vaccines, there is a chance of side effects. These are usually mild and go away on their own. Serious reactions are also possible but are rare. Most people who get Tdap vaccine do not have any problems with  it. Mild problems following Tdap: (Did not interfere with activities)  Pain where the shot was given (about 3 in 4 adolescents or 2 in 3 adults)  Redness or swelling where the shot was given (about 1 person in 5)  Mild fever of at least 100.63F (up to about 1 in 25 adolescents or 1 in 100 adults)  Headache (about 3 or 4 people in 10)  Tiredness (about 1 person in 3 or 4)  Nausea, vomiting, diarrhea, stomach ache (up to 1 in 4 adolescents or 1 in 10 adults)  Chills, sore joints (about 1 person in 10)  Body aches (about 1 person in 3 or 4)  Rash, swollen glands (uncommon)  Moderate problems following Tdap: (Interfered with activities, but did not require medical attention)  Pain where the shot was given (up to 1 in 5 or 6)  Redness or swelling where the shot was given (up to about 1 in 16 adolescents or 1 in 12 adults)  Fever over 102F (about 1 in 100 adolescents or 1 in 250 adults)  Headache (about 1 in 7 adolescents or 1 in 10 adults)  Nausea, vomiting, diarrhea, stomach ache (up to 1 or 3 people in 100)  Swelling of the entire arm where the shot was given (up to about 1 in 500).  Severe problems following Tdap: (Unable to perform usual activities; required medical attention)  Swelling, severe pain, bleeding and redness in the arm where the shot was given (rare).  Problems that could happen after any vaccine:  People sometimes faint after a medical procedure, including vaccination. Sitting or lying down for about 15 minutes can help prevent fainting, and injuries caused by a fall. Tell your doctor if  you feel dizzy, or have vision changes or ringing in the ears.  Some people get severe pain in the shoulder and have difficulty moving the arm where a shot was given. This happens very rarely.  Any medication can cause a severe allergic reaction. Such reactions from a vaccine are very rare, estimated at fewer than 1 in a million doses, and would happen within a few minutes to a few hours after the vaccination. As with any medicine, there is a very remote chance of a vaccine causing a serious injury or death. The safety of vaccines is always being monitored. For more information, visit: http://floyd.org/ 5. What if there is a serious problem? What should I look for? Look for anything that concerns you, such as signs of a severe allergic reaction, very high fever, or unusual behavior. Signs of a severe allergic reaction can include hives, swelling of the face and throat, difficulty breathing, a fast heartbeat, dizziness, and weakness. These would usually start a few minutes to a few hours after the vaccination. What should I do?  If you think it is a severe allergic reaction or other emergency that can't wait, call 9-1-1 or get the person to the nearest hospital. Otherwise, call your doctor.  Afterward, the reaction should be reported to the Vaccine Adverse Event Reporting System (VAERS). Your doctor might file this report, or you can do it yourself through the VAERS web site at www.vaers.LAgents.no, or by calling 1-7726898110. ? VAERS does not give medical advice. 6. The National Vaccine Injury Compensation Program The [employer] Vaccine Injury Compensation Program (VICP) is a federal program that was created to compensate people who may have been injured by certain vaccines. Persons who believe they may have been injured by a vaccine can learn about the program  and about filing a claim by calling 1-807-560-9573 or visiting the VICP website at SpiritualWord.at. There is a time  limit to file a claim for compensation. 7. How can I learn more?  Ask your doctor. He or she can give you the vaccine package insert or suggest other sources of information.  Call your local or state health department.  Contact the Centers for Disease Control and Prevention (CDC): ? Call 7064437073 (1-800-CDC-INFO) or ? Visit CDC's website at PicCapture.uy CDC Tdap Vaccine VIS (03/21/13) This information is not intended to replace advice given to you by your health care provider. Make sure you discuss any questions you have with your health care provider. Document Released: 07/14/2011 Document Revised: 10/03/2015 Document Reviewed: 10/03/2015 Elsevier Interactive Patient Education  2017 [employer].

## 2017-07-14 NOTE — Progress Notes (Signed)
    Subjective:  Tamara Lowery is a 22 y.o. G1P0 at 1329w4d being seen today for ongoing prenatal care.  She is currently monitored for the following issues for this low-risk pregnancy and has Supervision of normal first pregnancy, antepartum and Low blood hemoglobin A2 on their problem list.  Patient reports eyes itching.  Contractions: Not present. Vag. Bleeding: None.  Movement: Present. Denies leaking of fluid. No one else at home is having any problem with their eyes being red or itching.  The following portions of the patient's history were reviewed and updated as appropriate: allergies, current medications, past family history, past medical history, past social history, past surgical history and problem list. Problem list updated.  Objective:   Vitals:   07/14/17 0914  BP: (!) 126/59  Pulse: 71  Weight: 220 lb (99.8 kg)    Fetal Status: Fetal Heart Rate (bpm): 130 Fundal Height: 29 cm Movement: Present     General:  Alert, oriented and cooperative. Patient is in no acute distress.  Skin: Skin is warm and dry. No rash noted.   Cardiovascular: Normal heart rate noted  Respiratory: Normal respiratory effort, no problems with respiration noted  Abdomen: Soft, gravid, appropriate for gestational age. Pain/Pressure: Absent     Pelvic:  Cervical exam deferred        Extremities: Normal range of motion.  Edema: Mild pitting, slight indentation  Mental Status: Normal mood and affect. Normal behavior. Normal judgment and thought content.  Eyes, no drainage, no redness on schera, inner lower lids very red bilaterally   Urinalysis:      Assessment and Plan:  Pregnancy: G1P0 at 3229w4d  1. Supervision of normal first pregnancy, antepartum  Eyes not draining -Allergy eye drops or claritin or zyrtec OTC at her pharmacy Support knees highs for work due to edema in feet and ankles Currently client has gained 50 pounds at 28 weeks.  Reviewed diet and she has been eating somewhat  healthy foods but likely is having a high carbohydrate diet.  Advised decrease chips, tortillas and cheese. - Tdap vaccine greater than or equal to 7yo IM  Preterm labor symptoms and general obstetric precautions including but not limited to vaginal bleeding, contractions, leaking of fluid and fetal movement were reviewed in detail with the patient. Please refer to After Visit Summary for other counseling recommendations.  Return in about 2 weeks (around 07/28/2017).  Nolene BernheimERRI BURLESON, RN, MSN, NP-BC Nurse Practitioner, [facility], [facility] 07/14/2017 10:06 PM

## 2017-07-15 LAB — RPR: RPR: NONREACTIVE

## 2017-07-15 LAB — GLUCOSE TOLERANCE, 2 HOURS W/ 1HR
GLUCOSE, FASTING: 93 mg/dL — AB (ref 65–91)
Glucose, 1 hour: 106 mg/dL (ref 65–179)
Glucose, 2 hour: 127 mg/dL (ref 65–152)

## 2017-07-15 LAB — CBC
HEMATOCRIT: 34.6 % (ref 34.0–46.6)
HEMOGLOBIN: 11.6 g/dL (ref 11.1–15.9)
MCH: 32.6 pg (ref 26.6–33.0)
MCHC: 33.5 g/dL (ref 31.5–35.7)
MCV: 97 fL (ref 79–97)
Platelets: 268 10*3/uL (ref 150–450)
RBC: 3.56 x10E6/uL — AB (ref 3.77–5.28)
RDW: 13.3 % (ref 12.3–15.4)
WBC: 11.7 10*3/uL — ABNORMAL HIGH (ref 3.4–10.8)

## 2017-07-15 LAB — HIV ANTIBODY (ROUTINE TESTING W REFLEX): HIV Screen 4th Generation wRfx: NONREACTIVE

## 2017-07-28 ENCOUNTER — Ambulatory Visit (INDEPENDENT_AMBULATORY_CARE_PROVIDER_SITE_OTHER): Payer: Medicaid Other | Admitting: Advanced Practice Midwife

## 2017-07-28 VITALS — BP 117/71 | HR 90 | Wt 220.2 lb

## 2017-07-28 DIAGNOSIS — J309 Allergic rhinitis, unspecified: Secondary | ICD-10-CM

## 2017-07-28 DIAGNOSIS — R6889 Other general symptoms and signs: Secondary | ICD-10-CM

## 2017-07-28 DIAGNOSIS — H1013 Acute atopic conjunctivitis, bilateral: Secondary | ICD-10-CM

## 2017-07-28 DIAGNOSIS — Z3400 Encounter for supervision of normal first pregnancy, unspecified trimester: Secondary | ICD-10-CM

## 2017-07-28 DIAGNOSIS — O24410 Gestational diabetes mellitus in pregnancy, diet controlled: Secondary | ICD-10-CM

## 2017-07-28 MED ORDER — CETIRIZINE HCL 10 MG PO TABS: 10.0000 mg | ORAL_TABLET | Freq: Every day | ORAL | 2 refills | Status: DC

## 2017-07-28 MED ORDER — OLOPATADINE HCL 0.1 % OP SOLN: 1.0000 [drp] | Freq: Two times a day (BID) | OPHTHALMIC | 2 refills | Status: DC

## 2017-07-28 NOTE — Progress Notes (Signed)
Pt states eyes are still irritated & itchy even after using eye drops.

## 2017-07-28 NOTE — Patient Instructions (Signed)
Third Trimester of Pregnancy The third trimester is from week 28 through week 40 (months 7 through 9). The third trimester is a time when the unborn baby (fetus) is growing rapidly. At the end of the ninth month, the fetus is about 20 inches in length and weighs 6-10 pounds. Body changes during your third trimester Your body will continue to go through many changes during pregnancy. The changes vary from woman to woman. During the third trimester:  Your weight will continue to increase. You can expect to gain 25-35 pounds (11-16 kg) by the end of the pregnancy.  You may begin to get stretch marks on your hips, abdomen, and breasts.  You may urinate more often because the fetus is moving lower into your pelvis and pressing on your bladder.  You may develop or continue to have heartburn. This is caused by increased hormones that slow down muscles in the digestive tract.  You may develop or continue to have constipation because increased hormones slow digestion and cause the muscles that push waste through your intestines to relax.  You may develop hemorrhoids. These are swollen veins (varicose veins) in the rectum that can itch or be painful.  You may develop swollen, bulging veins (varicose veins) in your legs.  You may have increased body aches in the pelvis, back, or thighs. This is due to weight gain and increased hormones that are relaxing your joints.  You may have changes in your hair. These can include thickening of your hair, rapid growth, and changes in texture. Some women also have hair loss during or after pregnancy, or hair that feels dry or thin. Your hair will most likely return to normal after your baby is born.  Your breasts will continue to grow and they will continue to become tender. A yellow fluid (colostrum) may leak from your breasts. This is the first milk you are producing for your baby.  Your belly button may stick out.  You may notice more swelling in your hands,  face, or ankles.  You may have increased tingling or numbness in your hands, arms, and legs. The skin on your belly may also feel numb.  You may feel short of breath because of your expanding uterus.  You may have more problems sleeping. This can be caused by the size of your belly, increased need to urinate, and an increase in your body's metabolism.  You may notice the fetus "dropping," or moving lower in your abdomen (lightening).  You may have increased vaginal discharge.  You may notice your joints feel loose and you may have pain around your pelvic bone.  What to expect at prenatal visits You will have prenatal exams every 2 weeks until week 36. Then you will have weekly prenatal exams. During a routine prenatal visit:  You will be weighed to make sure you and the baby are growing normally.  Your blood pressure will be taken.  Your abdomen will be measured to track your baby's growth.  The fetal heartbeat will be listened to.  Any test results from the previous visit will be discussed.  You may have a cervical check near your due date to see if your cervix has softened or thinned (effaced).  You will be tested for Group B streptococcus. This happens between 35 and 37 weeks.  Your health care provider may ask you:  What your birth plan is.  How you are feeling.  If you are feeling the baby move.  If you have had   any abnormal symptoms, such as leaking fluid, bleeding, severe headaches, or abdominal cramping.  If you are using any tobacco products, including cigarettes, chewing tobacco, and electronic cigarettes.  If you have any questions.  Other tests or screenings that may be performed during your third trimester include:  Blood tests that check for low iron levels (anemia).  Fetal testing to check the health, activity level, and growth of the fetus. Testing is done if you have certain medical conditions or if there are problems during the  pregnancy.  Nonstress test (NST). This test checks the health of your baby to make sure there are no signs of problems, such as the baby not getting enough oxygen. During this test, a belt is placed around your belly. The baby is made to move, and its heart rate is monitored during movement.  What is false labor? False labor is a condition in which you feel small, irregular tightenings of the muscles in the womb (contractions) that usually go away with rest, changing position, or drinking water. These are called Braxton Hicks contractions. Contractions may last for hours, days, or even weeks before true labor sets in. If contractions come at regular intervals, become more frequent, increase in intensity, or become painful, you should see your health care provider. What are the signs of labor?  Abdominal cramps.  Regular contractions that start at 10 minutes apart and become stronger and more frequent with time.  Contractions that start on the top of the uterus and spread down to the lower abdomen and back.  Increased pelvic pressure and dull back pain.  A watery or bloody mucus discharge that comes from the vagina.  Leaking of amniotic fluid. This is also known as your "water breaking." It could be a slow trickle or a gush. Let your health care provider know if it has a color or strange odor. If you have any of these signs, call your health care provider right away, even if it is before your due date. Follow these instructions at home: Medicines  Follow your health care provider's instructions regarding medicine use. Specific medicines may be either safe or unsafe to take during pregnancy.  Take a prenatal vitamin that contains at least 600 micrograms (mcg) of folic acid.  If you develop constipation, try taking a stool softener if your health care provider approves. Eating and drinking  Eat a balanced diet that includes fresh fruits and vegetables, whole grains, good sources of protein  such as meat, eggs, or tofu, and low-fat dairy. Your health care provider will help you determine the amount of weight gain that is right for you.  Avoid raw meat and uncooked cheese. These carry germs that can cause birth defects in the baby.  If you have low calcium intake from food, talk to your health care provider about whether you should take a daily calcium supplement.  Eat four or five small meals rather than three large meals a day.  Limit foods that are high in fat and processed sugars, such as fried and sweet foods.  To prevent constipation: ? Drink enough fluid to keep your urine clear or pale yellow. ? Eat foods that are high in fiber, such as fresh fruits and vegetables, whole grains, and beans. Activity  Exercise only as directed by your health care provider. Most women can continue their usual exercise routine during pregnancy. Try to exercise for 30 minutes at least 5 days a week. Stop exercising if you experience uterine contractions.  Avoid heavy   lifting.  Do not exercise in extreme heat or humidity, or at high altitudes.  Wear low-heel, comfortable shoes.  Practice good posture.  You may continue to have sex unless your health care provider tells you otherwise. Relieving pain and discomfort  Take frequent breaks and rest with your legs elevated if you have leg cramps or low back pain.  Take warm sitz baths to soothe any pain or discomfort caused by hemorrhoids. Use hemorrhoid cream if your health care provider approves.  Wear a good support bra to prevent discomfort from breast tenderness.  If you develop varicose veins: ? Wear support pantyhose or compression stockings as told by your healthcare provider. ? Elevate your feet for 15 minutes, 3-4 times a day. Prenatal care  Write down your questions. Take them to your prenatal visits.  Keep all your prenatal visits as told by your health care provider. This is important. Safety  Wear your seat belt at  all times when driving.  Make a list of emergency phone numbers, including numbers for family, friends, the hospital, and police and fire departments. General instructions  Avoid cat litter boxes and soil used by cats. These carry germs that can cause birth defects in the baby. If you have a cat, ask someone to clean the litter box for you.  Do not travel far distances unless it is absolutely necessary and only with the approval of your health care provider.  Do not use hot tubs, steam rooms, or saunas.  Do not drink alcohol.  Do not use any products that contain nicotine or tobacco, such as cigarettes and e-cigarettes. If you need help quitting, ask your health care provider.  Do not use any medicinal herbs or unprescribed drugs. These chemicals affect the formation and growth of the baby.  Do not douche or use tampons or scented sanitary pads.  Do not cross your legs for long periods of time.  To prepare for the arrival of your baby: ? Take prenatal classes to understand, practice, and ask questions about labor and delivery. ? Make a trial run to the hospital. ? Visit the hospital and tour the maternity area. ? Arrange for maternity or paternity leave through employers. ? Arrange for family and friends to take care of pets while you are in the hospital. ? Purchase a rear-facing car seat and make sure you know how to install it in your car. ? Pack your hospital bag. ? Prepare the baby's nursery. Make sure to remove all pillows and stuffed animals from the baby's crib to prevent suffocation.  Visit your dentist if you have not gone during your pregnancy. Use a soft toothbrush to brush your teeth and be gentle when you floss. Contact a health care provider if:  You are unsure if you are in labor or if your water has broken.  You become dizzy.  You have mild pelvic cramps, pelvic pressure, or nagging pain in your abdominal area.  You have lower back pain.  You have persistent  nausea, vomiting, or diarrhea.  You have an unusual or bad smelling vaginal discharge.  You have pain when you urinate. Get help right away if:  Your water breaks before 37 weeks.  You have regular contractions less than 5 minutes apart before 37 weeks.  You have a fever.  You are leaking fluid from your vagina.  You have spotting or bleeding from your vagina.  You have severe abdominal pain or cramping.  You have rapid weight loss or weight gain.    You have shortness of breath with chest pain.  You notice sudden or extreme swelling of your face, hands, ankles, feet, or legs.  Your baby makes fewer than 10 movements in 2 hours.  You have severe headaches that do not go away when you take medicine.  You have vision changes. Summary  The third trimester is from week 28 through week 40, months 7 through 9. The third trimester is a time when the unborn baby (fetus) is growing rapidly.  During the third trimester, your discomfort may increase as you and your baby continue to gain weight. You may have abdominal, leg, and back pain, sleeping problems, and an increased need to urinate.  During the third trimester your breasts will keep growing and they will continue to become tender. A yellow fluid (colostrum) may leak from your breasts. This is the first milk you are producing for your baby.  False labor is a condition in which you feel small, irregular tightenings of the muscles in the womb (contractions) that eventually go away. These are called Braxton Hicks contractions. Contractions may last for hours, days, or even weeks before true labor sets in.  Signs of labor can include: abdominal cramps; regular contractions that start at 10 minutes apart and become stronger and more frequent with time; watery or bloody mucus discharge that comes from the vagina; increased pelvic pressure and dull back pain; and leaking of amniotic fluid. This information is not intended to replace advice  given to you by your health care provider. Make sure you discuss any questions you have with your health care provider. Document Released: 01/06/2001 Document Revised: 06/20/2015 Document Reviewed: 03/15/2012 Elsevier Interactive Patient Education  2017 [employer]  

## 2017-07-28 NOTE — Progress Notes (Signed)
   PRENATAL VISIT NOTE  Subjective:  Tamara Lowery is a 22 y.o. G1P0 at 2814w4d being seen today for ongoing prenatal care.  She is currently monitored for the following issues for this low-risk pregnancy and has Supervision of normal first pregnancy, antepartum; Low blood hemoglobin A2; and Excessive weight gain on their problem list.  Patient reports itching eyes bilaterally with redness and drainage and itching nasal passages. .  Contractions: Not present. Vag. Bleeding: None.  Movement: Present. Denies leaking of fluid.   The following portions of the patient's history were reviewed and updated as appropriate: allergies, current medications, past family history, past medical history, past social history, past surgical history and problem list. Problem list updated.  Objective:   Vitals:   07/28/17 1125  BP: 117/71  Pulse: 90  Weight: 220 lb 3.2 oz (99.9 kg)    Fetal Status: Fetal Heart Rate (bpm): 141   Movement: Present     General:  Alert, oriented and cooperative. Patient is in no acute distress.  Skin: Skin is warm and dry. No rash noted.   Cardiovascular: Normal heart rate noted  Respiratory: Normal respiratory effort, no problems with respiration noted  Abdomen: Soft, gravid, appropriate for gestational age.  Pain/Pressure: Absent     Pelvic: Cervical exam deferred        Extremities: Normal range of motion.  Edema: Mild pitting, slight indentation  Mental Status: Normal mood and affect. Normal behavior. Normal judgment and thought content.   Assessment and Plan:  Pregnancy: G1P0 at 4714w4d  1. Supervision of normal first pregnancy, antepartum --Anticipatory guidance about next visits/weeks of pregnancy given.  2. Itchy eyes --Visine not helping.  3. Allergic conjunctivitis of both eyes and rhinitis --Bilateral mild erythema, no exudate noted but pt reports crusting of both eyes upon awakening in the morning.  Irritation/itching of nasal passages as well. All  symptoms started 3 weeks ago, no known exposures/allergens.  No evidence of bacterial conjunctivitis, likely allergy.  Recommend systemic antihistamines but pt does not want to take medications so Rx for both Zyrtec, which pt may try, and patanol eye drops to reduce symptoms. - olopatadine (PATANOL) 0.1 % ophthalmic solution; Place 1 drop into both eyes 2 (two) times daily.  Dispense: 5 mL; Refill: 2 - cetirizine (ZYRTEC) 10 MG tablet; Take 1 tablet (10 mg total) by mouth daily.  Dispense: 30 tablet; Refill: 2  4. Diet controlled gestational diabetes mellitus (GDM) in third trimester --2 hour GTT with elevated fasting of 93 on 6/19.  Schedule diabetic education, start checking glucose QID when supplies are obtained. --Likely diet controlled with mild elevation only but discussed importance of blood sugar control/risks of hyperglycemia in pregnancy. --Recommend ADA diet  Preterm labor symptoms and general obstetric precautions including but not limited to vaginal bleeding, contractions, leaking of fluid and fetal movement were reviewed in detail with the patient. Please refer to After Visit Summary for other counseling recommendations.  Return in about 2 weeks (around 08/11/2017).  Future Appointments  Date Time Provider Department Center  08/05/2017 10:00 AM [facility]  08/11/2017 11:15 AM Marny LowensteinWenzel, Julie N, PA-C [facility]  08/25/2017 11:15 AM [facility]Lawrence, Erin, [facility]    Sharen CounterLisa Leftwich-Kirby, CNM

## 2017-08-03 ENCOUNTER — Inpatient Hospital Stay (HOSPITAL_COMMUNITY)
Admission: AD | Admit: 2017-08-03 | Discharge: 2017-08-04 | Disposition: A | Discharge status: Home / Self Care | Payer: Medicaid Other | Source: Ambulatory Visit | Attending: Family Medicine | Admitting: Family Medicine

## 2017-08-03 ENCOUNTER — Other Ambulatory Visit: Payer: Self-pay

## 2017-08-03 ENCOUNTER — Encounter (HOSPITAL_COMMUNITY): Payer: Self-pay

## 2017-08-03 DIAGNOSIS — O26899 Other specified pregnancy related conditions, unspecified trimester: Secondary | ICD-10-CM

## 2017-08-03 DIAGNOSIS — Z3A31 31 weeks gestation of pregnancy: Secondary | ICD-10-CM | POA: Diagnosis not present

## 2017-08-03 DIAGNOSIS — O4703 False labor before 37 completed weeks of gestation, third trimester: Secondary | ICD-10-CM | POA: Diagnosis not present

## 2017-08-03 DIAGNOSIS — O26893 Other specified pregnancy related conditions, third trimester: Secondary | ICD-10-CM | POA: Diagnosis present

## 2017-08-03 DIAGNOSIS — O479 False labor, unspecified: Secondary | ICD-10-CM

## 2017-08-03 DIAGNOSIS — R109 Unspecified abdominal pain: Secondary | ICD-10-CM | POA: Diagnosis present

## 2017-08-03 DIAGNOSIS — R102 Pelvic and perineal pain: Secondary | ICD-10-CM

## 2017-08-03 LAB — WET PREP, GENITAL
Clue Cells Wet Prep HPF POC: NONE SEEN
Sperm: NONE SEEN
Trich, Wet Prep: NONE SEEN
Yeast Wet Prep HPF POC: NONE SEEN

## 2017-08-03 MED ORDER — ACETAMINOPHEN 500 MG PO TABS
1000.0000 mg | ORAL_TABLET | Freq: Once | ORAL | Status: AC
  Administered 2017-08-03: 1000 mg via ORAL
  Filled 2017-08-03: qty 2

## 2017-08-03 NOTE — MAU Note (Signed)
Pt states that she was at work and felt a severe left mid abdominal pain that was sharp around 1900. She then felt the same pain around 2100.   She denies LOF or vaginal bleeding.   Reports +FM

## 2017-08-04 DIAGNOSIS — R102 Pelvic and perineal pain: Secondary | ICD-10-CM

## 2017-08-04 DIAGNOSIS — O479 False labor, unspecified: Secondary | ICD-10-CM

## 2017-08-04 DIAGNOSIS — O26899 Other specified pregnancy related conditions, unspecified trimester: Secondary | ICD-10-CM

## 2017-08-04 LAB — URINALYSIS, ROUTINE W REFLEX MICROSCOPIC
Bilirubin Urine: NEGATIVE
Glucose, UA: NEGATIVE mg/dL
Hgb urine dipstick: NEGATIVE
Ketones, ur: NEGATIVE mg/dL
Nitrite: NEGATIVE
Protein, ur: NEGATIVE mg/dL
Specific Gravity, Urine: 1.009 (ref 1.005–1.030)
pH: 6 (ref 5.0–8.0)

## 2017-08-04 LAB — GC/CHLAMYDIA PROBE AMP (~~LOC~~) NOT AT ARMC
Chlamydia: NEGATIVE
Neisseria Gonorrhea: NEGATIVE

## 2017-08-04 NOTE — Discharge Instructions (Signed)

## 2017-08-04 NOTE — MAU Provider Note (Signed)
Chief Complaint:  Abdominal Pain   First Provider Initiated Contact with Patient 08/04/17 0021      HPI: Tamara Lowery is a 22 y.o. G1P0 at 8031w4dwho presents to maternity admissions reporting abdominal pain. She reports abdominal pain that started last night while at work. She reports abdominal pain occurred twice, reports it as intermittent and sharp. Rates pain 9/10 when the pain occurs. She denies abdominal cramping or contractions. She reports good fetal movement, denies LOF, vaginal bleeding, vaginal itching/burning, urinary symptoms, h/a, dizziness, n/v, or fever/chills.    Past Medical History: Past Medical History:  Diagnosis Date  . Medical history non-contributory   . Trichomonal infection     Past obstetric history: OB History  Gravida Para Term Preterm AB Living  1            SAB TAB Ectopic Multiple Live Births               # Outcome Date GA Lbr Len/2nd Weight Sex Delivery Anes PTL Lv  1 Current             Past Surgical History: Past Surgical History:  Procedure Laterality Date  . NO PAST SURGERIES      Family History: History reviewed. No pertinent family history.  Social History: Social History   Tobacco Use  . Smoking status: Former Smoker    Packs/day: 0.02    Types: Cigarettes  . Smokeless tobacco: Never Used  Substance Use Topics  . Alcohol use: No  . Drug use: Yes    Types: Marijuana    Comment: stopped when found out she was pregnant    Allergies: No Known Allergies  Meds:  Medications Prior to Admission  Medication Sig Dispense Refill Last Dose  . cetirizine (ZYRTEC) 10 MG tablet Take 1 tablet (10 mg total) by mouth daily. 30 tablet 2   . olopatadine (PATANOL) 0.1 % ophthalmic solution Place 1 drop into both eyes 2 (two) times daily. 5 mL 2   . Prenatal Multivit-Min-Fe-FA (PRENATAL VITAMINS) 0.8 MG tablet Take 1 tablet by mouth daily. 30 tablet 12 Taking  . Prenatal w/o A Vit-Fe Fum-FA (PRENATAL VITAMIN W/FE, FA) 29-1 MG  CHEW Chew 1 tablet by mouth daily. (Patient not taking: Reported on 07/28/2017) 30 tablet 11 Not Taking    ROS:  Review of Systems  Respiratory: Negative.   Cardiovascular: Negative.   Gastrointestinal: Positive for abdominal pain. Negative for constipation, diarrhea, nausea and vomiting.  Genitourinary: Negative.   Neurological: Negative.    I have reviewed patient's Past Medical Hx, Surgical Hx, Family Hx, Social Hx, medications and allergies.   Physical Exam   Vitals:   08/03/17 2243 08/03/17 2250 08/04/17 0032  BP:  (!) 124/57 132/63  Pulse:  84 76  Resp:  20   Temp:  98.4 F (36.9 C)   TempSrc:  Oral   SpO2:  98%   Weight: 229 lb 4 oz (104 kg)     Constitutional: Well-developed, obese female in no acute distress.  Cardiovascular: normal rate Respiratory: normal effort GI: Abd soft, non-tender, gravid appropriate for gestational age.  MS: Extremities nontender, no edema, normal ROM Neurologic: Alert and oriented x 4.  GU: Neg CVAT. CERVICAL EXAM:  Dilation: Closed Effacement (%): Thick Cervical Position: Posterior Exam by:: Steward DroneVeronica Elleana Stillson, CNM  FHT:  Baseline 130 , moderate variability, accelerations present, no decelerations Contractions: occasional mild contractions    Labs: Results for orders placed or performed during the hospital encounter of 08/03/17 (from the  past 24 hour(s))  Urinalysis, Routine w reflex microscopic     Status: Abnormal   Collection Time: 08/03/17 11:10 PM  Result Value Ref Range   Color, Urine YELLOW YELLOW   APPearance HAZY (A) CLEAR   Specific Gravity, Urine 1.009 1.005 - 1.030   pH 6.0 5.0 - 8.0   Glucose, UA NEGATIVE NEGATIVE mg/dL   Hgb urine dipstick NEGATIVE NEGATIVE   Bilirubin Urine NEGATIVE NEGATIVE   Ketones, ur NEGATIVE NEGATIVE mg/dL   Protein, ur NEGATIVE NEGATIVE mg/dL   Nitrite NEGATIVE NEGATIVE   Leukocytes, UA LARGE (A) NEGATIVE   RBC / HPF 11-20 0 - 5 RBC/hpf   WBC, UA 21-50 0 - 5 WBC/hpf   Bacteria, UA MANY  (A) NONE SEEN   Squamous Epithelial / LPF 11-20 0 - 5   Mucus PRESENT   Wet prep, genital     Status: Abnormal   Collection Time: 08/03/17 11:20 PM  Result Value Ref Range   Yeast Wet Prep HPF POC NONE SEEN NONE SEEN   Trich, Wet Prep NONE SEEN NONE SEEN   Clue Cells Wet Prep HPF POC NONE SEEN NONE SEEN   WBC, Wet Prep HPF POC MODERATE (A) NONE SEEN   Sperm NONE SEEN    A/Positive/-- (02/15 1444)  MAU Course/MDM: Orders Placed This Encounter  Procedures  . Wet prep, genital  . Culture, OB Urine  . Urinalysis, Routine w reflex microscopic  . Discharge patient Discharge disposition: 01-Home or Self Care; Discharge patient date: 08/04/2017   Wet prep- negative  Urine culture- pending  GC/C- pending   Meds ordered this encounter  Medications  . acetaminophen (TYLENOL) tablet 1,000 mg   NST reviewed- reactive for gestational age  Treatments in MAU included 1000mg  Tylenol for abdominal pain. Patient reports relief of pain with medication treatment.    Pt discharge. Pt stable at the time of discharge.   Today's evaluation included a work-up for preterm labor which can be life-threatening for both mom and baby.  Assessment: 1. Pain of round ligament affecting pregnancy, antepartum   2. Braxton Hick's contraction     Plan: Discharge home Preterm Labor precautions and fetal kick counts Follow up as scheduled in the office  Return to MAU as needed  Discussed safe medications during pregnancy  Discussed use of Tylenol during pregnancy for RLP   Follow-up Information    [facility] for [facility].   Specialty:  Obstetrics and Gynecology Why:  Follow up as scheduled for prenatal appointments  Contact information: [address] [place] [address] [phone]          Allergies as of 08/04/2017   No Known Allergies     Medication List    TAKE these medications   cetirizine 10 MG tablet Commonly known as:  ZYRTEC Take 1  tablet (10 mg total) by mouth daily.   olopatadine 0.1 % ophthalmic solution Commonly known as:  PATANOL Place 1 drop into both eyes 2 (two) times daily.   prenatal vitamin w/FE, FA 29-1 MG Chew Chew 1 tablet by mouth daily.   Prenatal Vitamins 0.8 MG tablet Take 1 tablet by mouth daily.      Steward Drone Certified Nurse-Midwife 08/04/2017 1:29 AM

## 2017-08-05 ENCOUNTER — Encounter: Payer: Medicaid Other | Attending: Obstetrics & Gynecology | Admitting: *Deleted

## 2017-08-05 ENCOUNTER — Ambulatory Visit: Payer: Medicaid Other | Admitting: *Deleted

## 2017-08-05 DIAGNOSIS — Z713 Dietary counseling and surveillance: Secondary | ICD-10-CM | POA: Diagnosis not present

## 2017-08-05 DIAGNOSIS — Z3A28 28 weeks gestation of pregnancy: Secondary | ICD-10-CM | POA: Insufficient documentation

## 2017-08-05 DIAGNOSIS — O24419 Gestational diabetes mellitus in pregnancy, unspecified control: Secondary | ICD-10-CM | POA: Insufficient documentation

## 2017-08-05 DIAGNOSIS — O24410 Gestational diabetes mellitus in pregnancy, diet controlled: Secondary | ICD-10-CM

## 2017-08-05 LAB — CULTURE, OB URINE

## 2017-08-05 MED ORDER — GLUCOSE BLOOD VI STRP: ORAL_STRIP | 12 refills | Status: DC

## 2017-08-05 MED ORDER — ACCU-CHEK FASTCLIX LANCETS MISC: 1.0000 | Freq: Four times a day (QID) | 12 refills | Status: DC

## 2017-08-05 MED ORDER — ACCU-CHEK GUIDE W/DEVICE KIT: 1.0000 | PACK | Freq: Once | 0 refills | Status: AC

## 2017-08-05 NOTE — Progress Notes (Signed)
  Patient was seen on 08/05/2017 for Gestational Diabetes self-management. EDD 10/02/2017. Patient states no history of GDM. Diet history obtained. Patient eats fair variety of all food groups and beverages include water and occasional regular Sprite soda.  The following learning objectives were met by the patient :   States the definition of Gestational Diabetes  States why dietary management is important in controlling blood glucose  Describes the effects of carbohydrates on blood glucose levels  Demonstrates ability to create a balanced meal plan  Demonstrates carbohydrate counting   States when to check blood glucose levels  Demonstrates proper blood glucose monitoring techniques  States the effect of stress and exercise on blood glucose levels  States the importance of limiting caffeine and abstaining from alcohol and smoking  Plan:  Aim for 3 Carb Choices per meal (45 grams) +/- 1 either way  Aim for 1-2 Carbs per snack Begin reading food labels for Total Carbohydrate of foods Consider  increasing your activity level by walking or other activity daily as tolerated Begin checking BG before breakfast and 2 hours after first bite of breakfast, lunch and dinner as directed by MD  Bring Log Book/Sheet to every medical appointment   Patient was introduced to [employer] and plans to use as record of BG electronically  Take medication if directed by MD  Blood glucose monitor Rx called into pharmacy: Accu Check Guide with Fast Clix drums Patient instructed to test pre breakfast and 2 hours each meal as directed by MD  Patient instructed to monitor glucose levels: FBS: 60 - 95 mg/dl 2 hour: <120 mg/dl  Patient received the following handouts:  Nutrition Diabetes and Pregnancy  Carbohydrate Counting List  Patient will be seen for follow-up as needed.

## 2017-08-07 ENCOUNTER — Encounter (HOSPITAL_COMMUNITY): Payer: Self-pay

## 2017-08-07 ENCOUNTER — Other Ambulatory Visit: Payer: Self-pay

## 2017-08-07 ENCOUNTER — Inpatient Hospital Stay (HOSPITAL_COMMUNITY)
Admission: AD | Admit: 2017-08-07 | Discharge: 2017-08-07 | Disposition: A | Discharge status: Home / Self Care | Payer: Medicaid Other | Source: Ambulatory Visit | Attending: Obstetrics & Gynecology | Admitting: Obstetrics & Gynecology

## 2017-08-07 DIAGNOSIS — R197 Diarrhea, unspecified: Secondary | ICD-10-CM | POA: Insufficient documentation

## 2017-08-07 DIAGNOSIS — R112 Nausea with vomiting, unspecified: Secondary | ICD-10-CM | POA: Diagnosis not present

## 2017-08-07 DIAGNOSIS — Z3A32 32 weeks gestation of pregnancy: Secondary | ICD-10-CM | POA: Diagnosis not present

## 2017-08-07 DIAGNOSIS — T6291XA Toxic effect of unspecified noxious substance eaten as food, accidental (unintentional), initial encounter: Secondary | ICD-10-CM | POA: Diagnosis not present

## 2017-08-07 DIAGNOSIS — Z87891 Personal history of nicotine dependence: Secondary | ICD-10-CM | POA: Diagnosis not present

## 2017-08-07 DIAGNOSIS — O9A213 Injury, poisoning and certain other consequences of external causes complicating pregnancy, third trimester: Secondary | ICD-10-CM | POA: Insufficient documentation

## 2017-08-07 DIAGNOSIS — IMO0001 Reserved for inherently not codable concepts without codable children: Secondary | ICD-10-CM

## 2017-08-07 DIAGNOSIS — O9989 Other specified diseases and conditions complicating pregnancy, childbirth and the puerperium: Secondary | ICD-10-CM

## 2017-08-07 DIAGNOSIS — R110 Nausea: Secondary | ICD-10-CM | POA: Diagnosis present

## 2017-08-07 LAB — URINALYSIS, ROUTINE W REFLEX MICROSCOPIC
BILIRUBIN URINE: NEGATIVE
GLUCOSE, UA: NEGATIVE mg/dL
Ketones, ur: 80 mg/dL — AB
Nitrite: NEGATIVE
PH: 6 (ref 5.0–8.0)
Protein, ur: 30 mg/dL — AB
SPECIFIC GRAVITY, URINE: 1.023 (ref 1.005–1.030)

## 2017-08-07 LAB — BASIC METABOLIC PANEL
ANION GAP: 11 (ref 5–15)
BUN: 5 mg/dL — AB (ref 6–20)
CHLORIDE: 102 mmol/L (ref 98–111)
CO2: 18 mmol/L — AB (ref 22–32)
CREATININE: 0.31 mg/dL — AB (ref 0.44–1.00)
Calcium: 8.6 mg/dL — ABNORMAL LOW (ref 8.9–10.3)
GLUCOSE: 89 mg/dL (ref 70–99)
Potassium: 3.2 mmol/L — ABNORMAL LOW (ref 3.5–5.1)
Sodium: 131 mmol/L — ABNORMAL LOW (ref 135–145)

## 2017-08-07 MED ORDER — LACTATED RINGERS IV BOLUS
1000.0000 mL | Freq: Once | INTRAVENOUS | Status: AC
  Administered 2017-08-07: 1000 mL via INTRAVENOUS

## 2017-08-07 MED ORDER — DEXTROSE IN LACTATED RINGERS 5 % IV SOLN
Freq: Once | INTRAVENOUS | Status: AC
  Administered 2017-08-07: 18:00:00 via INTRAVENOUS
  Filled 2017-08-07: qty 10

## 2017-08-07 MED ORDER — ONDANSETRON 4 MG PO TBDP: 4.0000 mg | ORAL_TABLET | Freq: Three times a day (TID) | ORAL | 0 refills | Status: DC | PRN

## 2017-08-07 MED ORDER — ONDANSETRON HCL 4 MG/2ML IJ SOLN
4.0000 mg | Freq: Once | INTRAMUSCULAR | Status: AC
  Administered 2017-08-07: 4 mg via INTRAVENOUS
  Filled 2017-08-07: qty 2

## 2017-08-07 NOTE — MAU Note (Signed)
Yesterday patient reports eating burgers at a friends out. Noted they were pink. Then today started having N/V/D. She questions if she has food poisoning as she is unable to tolerate and foods today. Vomited 4-5 times today she reports. 3 episodes of diarrhea.  +FM; FHR 153  Vitals:   08/07/17 1559  BP: 123/69  Pulse: (!) 119  Resp: 20  Temp: 98 F (36.7 C)  SpO2: 97%

## 2017-08-07 NOTE — MAU Provider Note (Signed)
Chief Complaint:  Nausea; Emesis; and Diarrhea   First Provider Initiated Contact with Patient 08/07/17 1634      HPI: Tamara Lowery is a 22 y.o. G1P0 at 5332w0dwho presents to maternity admissions reporting n/v/d. Symptoms began last night after eating at a cookout. No sick contacts. Feels like she has food poisoning. Has vomited 5 times today and had 5 episode of watery stool. No blood in stool.  She reports good fetal movement, denies LOF, vaginal bleeding, vaginal itching/burning, urinary symptoms, h/a, dizziness, or fever/chills.    HPI  Past Medical History: Past Medical History:  Diagnosis Date  . Medical history non-contributory   . Trichomonal infection     Past obstetric history: OB History  Gravida Para Term Preterm AB Living  1            SAB TAB Ectopic Multiple Live Births               # Outcome Date GA Lbr Len/2nd Weight Sex Delivery Anes PTL Lv  1 Current             Past Surgical History: Past Surgical History:  Procedure Laterality Date  . NO PAST SURGERIES      Family History: No family history on file.  Social History: Social History   Tobacco Use  . Smoking status: Former Smoker    Packs/day: 0.02    Types: Cigarettes  . Smokeless tobacco: Never Used  Substance Use Topics  . Alcohol use: No  . Drug use: Yes    Types: Marijuana    Comment: stopped when found out she was pregnant    Allergies: No Known Allergies  Meds:  Medications Prior to Admission  Medication Sig Dispense Refill Last Dose  . ACCU-CHEK FASTCLIX LANCETS MISC 1 Device by Percutaneous route 4 (four) times daily. 100 each 12   . cetirizine (ZYRTEC) 10 MG tablet Take 1 tablet (10 mg total) by mouth daily. 30 tablet 2   . glucose blood (ACCU-CHEK GUIDE) test strip Use as instructed QID 100 each 12   . olopatadine (PATANOL) 0.1 % ophthalmic solution Place 1 drop into both eyes 2 (two) times daily. 5 mL 2   . Prenatal Multivit-Min-Fe-FA (PRENATAL VITAMINS) 0.8 MG  tablet Take 1 tablet by mouth daily. 30 tablet 12 Taking  . Prenatal w/o A Vit-Fe Fum-FA (PRENATAL VITAMIN W/FE, FA) 29-1 MG CHEW Chew 1 tablet by mouth daily. (Patient not taking: Reported on 07/28/2017) 30 tablet 11 Not Taking    ROS:  Review of Systems  Constitutional: Negative.   Gastrointestinal: Positive for diarrhea, nausea and vomiting. Negative for abdominal pain, anal bleeding, blood in stool and constipation.  Genitourinary: Negative.    I have reviewed patient's Past Medical Hx, Surgical Hx, Family Hx, Social Hx, medications and allergies.   Physical Exam   Patient Vitals for the past 24 hrs:  BP Temp Temp src Pulse Resp SpO2 Height Weight  08/07/17 1904 (!) 115/56 98.8 F (37.1 C) Oral 92 20 100 % - -  08/07/17 1800 - - - - - 100 % - -  08/07/17 1700 - - - - - 99 % - -  08/07/17 1559 123/69 98 F (36.7 C) Oral (!) 119 20 97 % 5\' 7"  (1.702 m) 223 lb 0.6 oz (101.2 kg)   Constitutional: Well-developed, well-nourished female in no acute distress.  Cardiovascular: normal rate Respiratory: normal effort GI: Abd soft, non-tender, gravid appropriate for gestational age.  MS: Extremities nontender, no edema, normal  ROM Neurologic: Alert and oriented x 4.  HENT: lips & mucous membranes dry Skin: warm & dry, Skin turgor normal.     FHT:  Baseline 135 , moderate variability, accelerations present, no decelerations    Labs: Results for orders placed or performed during the hospital encounter of 08/07/17 (from the past 24 hour(s))  Urinalysis, Routine w reflex microscopic     Status: Abnormal   Collection Time: 08/07/17  4:06 PM  Result Value Ref Range   Color, Urine AMBER (A) YELLOW   APPearance HAZY (A) CLEAR   Specific Gravity, Urine 1.023 1.005 - 1.030   pH 6.0 5.0 - 8.0   Glucose, UA NEGATIVE NEGATIVE mg/dL   Hgb urine dipstick MODERATE (A) NEGATIVE   Bilirubin Urine NEGATIVE NEGATIVE   Ketones, ur 80 (A) NEGATIVE mg/dL   Protein, ur 30 (A) NEGATIVE mg/dL   Nitrite  NEGATIVE NEGATIVE   Leukocytes, UA MODERATE (A) NEGATIVE   RBC / HPF 11-20 0 - 5 RBC/hpf   WBC, UA 11-20 0 - 5 WBC/hpf   Bacteria, UA RARE (A) NONE SEEN   Squamous Epithelial / LPF 11-20 0 - 5   Mucus PRESENT    Non Squamous Epithelial 0-5 (A) NONE SEEN  Basic metabolic panel     Status: Abnormal   Collection Time: 08/07/17  5:33 PM  Result Value Ref Range   Sodium 131 (L) 135 - 145 mmol/L   Potassium 3.2 (L) 3.5 - 5.1 mmol/L   Chloride 102 98 - 111 mmol/L   CO2 18 (L) 22 - 32 mmol/L   Glucose, Bld 89 70 - 99 mg/dL   BUN 5 (L) 6 - 20 mg/dL   Creatinine, Ser 1.61 (L) 0.44 - 1.00 mg/dL   Calcium 8.6 (L) 8.9 - 10.3 mg/dL   GFR calc non Af Amer >60 >60 mL/min   GFR calc Af Amer >60 >60 mL/min   Anion gap 11 5 - 15   A/Positive/-- (02/15 1444)  Imaging:    MAU Course/MDM: Orders Placed This Encounter  Procedures  . Urinalysis, Routine w reflex microscopic  . Basic metabolic panel  . Contact Isolation: Enteric    Meds ordered this encounter  Medications  . FOLLOWED BY Linked Order Group   . lactated ringers bolus 1,000 mL   . multivitamins adult (MVI -12) 10 mL in dextrose 5% lactated ringers 1,000 mL infusion  . ondansetron (ZOFRAN) injection 4 mg  . ondansetron (ZOFRAN ODT) 4 MG disintegrating tablet    Sig: Take 1 tablet (4 mg total) by mouth every 8 (eight) hours as needed for nausea or vomiting.    Dispense:  15 tablet    Refill:  0    Order Specific Question:   Supervising Provider    Answer:   Jaynie Collins A [[id number]]    NST reviewed Treatments in MAU included IV fluids & zofran.   Will d/c home with antiemetic. Bland diet for diarrhea. Note for work for tomorrow.   Assessment: 1. Food poisoning, accidental or unintentional, initial encounter   2. [redacted] weeks gestation of pregnancy     Plan: Discharge home Discussed reasons to return to MAU    Allergies as of 08/07/2017   No Known Allergies     Medication List    TAKE these medications   ACCU-CHEK  FASTCLIX LANCETS Misc 1 Device by Percutaneous route 4 (four) times daily.   cetirizine 10 MG tablet Commonly known as:  ZYRTEC Take 1 tablet (10 mg total) by  mouth daily.   glucose blood test strip Commonly known as:  ACCU-CHEK GUIDE Use as instructed QID   olopatadine 0.1 % ophthalmic solution Commonly known as:  PATANOL Place 1 drop into both eyes 2 (two) times daily.   ondansetron 4 MG disintegrating tablet Commonly known as:  ZOFRAN ODT Take 1 tablet (4 mg total) by mouth every 8 (eight) hours as needed for nausea or vomiting.   prenatal vitamin w/FE, FA 29-1 MG Chew Chew 1 tablet by mouth daily.   Prenatal Vitamins 0.8 MG tablet Take 1 tablet by mouth daily.       Judeth Horn, FNP-C 08/07/2017 4:34 PM

## 2017-08-07 NOTE — Progress Notes (Addendum)
G1 @ [redacted] wksga. Here for N/V/D. At a cookout yesterday "thinks has food poisoning from eating somewhat raw hamburger meat". Denies LOF or bleeding. + FM  EFM applied with pulse ox   VSS but noted m.tachy 119 initially.   Tolerating PO water.   Urine sample collected and sent to lab  1643: provider at bs assessing.   1733: labs and IV done  1802: multivitamin bag up per order on the pump. States feeling better. Tolerating PO fluids.   1905: multivitamin bag still infusing on the pump.   1937: 300 remaining of Multivitamin bag. Pt resting quietly with eyes closed.   Provider made aware of pt's status. Putting in orders for d/c.   Iv d/cd'd with tip noted and intact  1953: D/c instructions given with pt understanding. Pt left unit via ambulatory being picked up by family.

## 2017-08-07 NOTE — Discharge Instructions (Signed)
Food Poisoning Food poisoning is an illness that is caused by eating or drinking contaminated foods or drinks. In most cases, food poisoning is mild and lasts 1-2 days. However, some cases can be serious, especially for people who have weak body defense (immune) systems, older people, children and infants, and pregnant women. What are the causes? Foods can become contaminated with viruses, bacteria, parasites, mold, or chemicals as a result of:  Poor personal hygiene, such as poor hand washing practices.  Storing food improperly, such as not refrigerating raw meat.  Using unclean surfaces for serving, preparing, and storing food.  Cooking or eating with unclean utensils.  If contaminated food is eaten, viruses, bacteria, or parasites can harm the intestine. This often causes severe diarrhea. The most common causes of food poisoning include:  Viruses, such as: ? Norovirus. ? Rotavirus.  Bacteria, such as: ? Salmonella. ? Listeria. ? E. coli (Escherichia coli).  Parasites, such as: ? Giardia. ? Toxoplasmosis.  What are the signs or symptoms? Symptoms may take several hours to appear after you consume contaminated food or drink. Symptoms include:  Nausea.  Vomiting.  Cramping.  Diarrhea.  Fever and chills.  Muscle aches.  Dehydration. Dehydration can cause you to be tired and thirsty, have a dry mouth, and urinate less frequently.  How is this diagnosed? Your health care provider can diagnose food poisoning with a medical history and physical exam. This will include asking you what you have recently eaten. You may also have tests, including:  Blood tests.  Stool tests.  How is this treated? Treatment focuses on relieving your symptoms and making sure that you are hydrated. You may also be given medicines. In severe cases, hospitalization may be required and you may need to receive fluids through an IV tube. Follow these instructions at home: Eating and  drinking   Drink enough fluids to keep your urine clear or pale yellow. You may need to drink small amounts of clear liquids frequently.  Avoid milk, caffeine, and alcohol.  Ask your health care provider for specific rehydration instructions.  Eat small, frequent meals rather than large meals. Medicines  Take over-the-counter and prescription medicines only as told by your health care provider. Ask your health care provider if you should continue to take any of your regular prescribed and over-the-counter medicines.  If you were prescribed an antibiotic medicine, take it as told by your health care provider. Do not stop taking the antibiotic even if you start to feel better. General instructions  Wash your hands thoroughly before you prepare food and after you go to the bathroom (use the toilet). Make sure people who live with you also wash their hands often.  Clean surfaces that you touch with a product that contains chlorine bleach.  Keep all follow-up visits as told by your health care provider. This is important. How is this prevented?  Wash your hands, food preparation surfaces, and utensils thoroughly before and after you handle raw foods.  Use separate food preparation surfaces and storage spaces for raw meat and for fruits and vegetables.  Keep refrigerated foods colder than 40F (5C).  Serve hot foods immediately or keep them heated above 140F (60C).  Store dry foods in cool, dry spaces away from excess heat or moisture. Throw out any foods that do not smell right or are in cans that are bulging.  Follow approved canning procedures.  Heat canned foods thoroughly before you taste them.  Drink bottled or sterile water when you travel.   Get help right away if: Call 911 or go to the emergency room if:  You have difficulty breathing, swallowing, talking, or moving.  You develop blurred vision.  You cannot eat or drink without vomiting.  You faint.  Your eyes  turn yellow.  Your vomiting or diarrhea is persistent.  Abdominal pain develops, increases, or localizes in one small area.  You have a fever.  You have blood or mucus in your stools, or your stools look dark black and tarry.  You have signs of dehydration, such as: ? Dark urine, very little urine, or no urine. ? Cracked lips. ? Not making tears while crying. ? Dry mouth. ? Sunken eyes. ? Sleepiness. ? Weakness. ? Dizziness.  This information is not intended to replace advice given to you by your health care provider. Make sure you discuss any questions you have with your health care provider. Document Released: 10/11/2003 Document Revised: 06/11/2015 Document Reviewed: 07/16/2014 Elsevier Interactive Patient Education  2018 [employer] Inc.  

## 2017-08-11 ENCOUNTER — Ambulatory Visit (INDEPENDENT_AMBULATORY_CARE_PROVIDER_SITE_OTHER): Payer: Medicaid Other | Admitting: Medical

## 2017-08-11 ENCOUNTER — Encounter: Payer: Self-pay | Admitting: Medical

## 2017-08-11 VITALS — BP 106/68 | HR 88 | Wt 220.7 lb

## 2017-08-11 DIAGNOSIS — R635 Abnormal weight gain: Secondary | ICD-10-CM

## 2017-08-11 DIAGNOSIS — Z3400 Encounter for supervision of normal first pregnancy, unspecified trimester: Secondary | ICD-10-CM

## 2017-08-11 NOTE — Patient Instructions (Signed)
Research childbirth classes and hospital preregistration at [email or web address]  Fetal Movement Counts Patient Name: ________________________________________________ Patient Due Date: ____________________ What is a fetal movement count? A fetal movement count is the number of times that you feel your baby move during a certain amount of time. This may also be called a fetal kick count. A fetal movement count is recommended for every pregnant woman. You may be asked to start counting fetal movements as early as week 28 of your pregnancy. Pay attention to when your baby is most active. You may notice your baby's sleep and wake cycles. You may also notice things that make your baby move more. You should do a fetal movement count:  When your baby is normally most active.  At the same time each day.  A good time to count movements is while you are resting, after having something to eat and drink. How do I count fetal movements? 1. Find a quiet, comfortable area. Sit, or lie down on your side. 2. Write down the date, the start time and stop time, and the number of movements that you felt between those two times. Take this information with you to your health care visits. 3. For 2 hours, count kicks, flutters, swishes, rolls, and jabs. You should feel at least 10 movements during 2 hours. 4. You may stop counting after you have felt 10 movements. 5. If you do not feel 10 movements in 2 hours, have something to eat and drink. Then, keep resting and counting for 1 hour. If you feel at least 4 movements during that hour, you may stop counting. Contact a health care provider if:  You feel fewer than 4 movements in 2 hours.  Your baby is not moving like he or she usually does. Date: ____________ Start time: ____________ Stop time: ____________ Movements: ____________ Date: ____________ Start time: ____________ Stop time: ____________ Movements: ____________ Date: ____________ Start time: ____________  Stop time: ____________ Movements: ____________ Date: ____________ Start time: ____________ Stop time: ____________ Movements: ____________ Date: ____________ Start time: ____________ Stop time: ____________ Movements: ____________ Date: ____________ Start time: ____________ Stop time: ____________ Movements: ____________ Date: ____________ Start time: ____________ Stop time: ____________ Movements: ____________ Date: ____________ Start time: ____________ Stop time: ____________ Movements: ____________ Date: ____________ Start time: ____________ Stop time: ____________ Movements: ____________ This information is not intended to replace advice given to you by your health care provider. Make sure you discuss any questions you have with your health care provider. Document Released: 02/11/2006 Document Revised: 09/11/2015 Document Reviewed: 02/21/2015 Elsevier Interactive Patient Education  2018 [employer]  Braxton Hicks Contractions Contractions of the uterus can occur throughout pregnancy, but they are not always a sign that you are in labor. You may have practice contractions called Braxton Hicks contractions. These false labor contractions are sometimes confused with true labor. What are Braxton Hicks contractions? Braxton Hicks contractions are tightening movements that occur in the muscles of the uterus before labor. Unlike true labor contractions, these contractions do not result in opening (dilation) and thinning of the cervix. Toward the end of pregnancy (32-34 weeks), Braxton Hicks contractions can happen more often and may become stronger. These contractions are sometimes difficult to tell apart from true labor because they can be very uncomfortable. You should not feel embarrassed if you go to the hospital with false labor. Sometimes, the only way to tell if you are in true labor is for your health care provider to look for changes in the cervix. The health care provider will   do a physical  exam and may monitor your contractions. If you are not in true labor, the exam should show that your cervix is not dilating and your water has not broken. If there are other health problems associated with your pregnancy, it is completely safe for you to be sent home with false labor. You may continue to have Braxton Hicks contractions until you go into true labor. How to tell the difference between true labor and false labor True labor  Contractions last 30-70 seconds.  Contractions become very regular.  Discomfort is usually felt in the top of the uterus, and it spreads to the lower abdomen and low back.  Contractions do not go away with walking.  Contractions usually become more intense and increase in frequency.  The cervix dilates and gets thinner. False labor  Contractions are usually shorter and not as strong as true labor contractions.  Contractions are usually irregular.  Contractions are often felt in the front of the lower abdomen and in the groin.  Contractions may go away when you walk around or change positions while lying down.  Contractions get weaker and are shorter-lasting as time goes on.  The cervix usually does not dilate or become thin. Follow these instructions at home:  Take over-the-counter and prescription medicines only as told by your health care provider.  Keep up with your usual exercises and follow other instructions from your health care provider.  Eat and drink lightly if you think you are going into labor.  If Braxton Hicks contractions are making you uncomfortable: ? Change your position from lying down or resting to walking, or change from walking to resting. ? Sit and rest in a tub of warm water. ? Drink enough fluid to keep your urine pale yellow. Dehydration may cause these contractions. ? Do slow and deep breathing several times an hour.  Keep all follow-up prenatal visits as told by your health care provider. This is  important. Contact a health care provider if:  You have a fever.  You have continuous pain in your abdomen. Get help right away if:  Your contractions become stronger, more regular, and closer together.  You have fluid leaking or gushing from your vagina.  You pass blood-tinged mucus (bloody show).  You have bleeding from your vagina.  You have low back pain that you never had before.  You feel your baby's head pushing down and causing pelvic pressure.  Your baby is not moving inside you as much as it used to. Summary  Contractions that occur before labor are called Braxton Hicks contractions, false labor, or practice contractions.  Braxton Hicks contractions are usually shorter, weaker, farther apart, and less regular than true labor contractions. True labor contractions usually become progressively stronger and regular and they become more frequent.  Manage discomfort from Braxton Hicks contractions by changing position, resting in a warm bath, drinking plenty of water, or practicing deep breathing. This information is not intended to replace advice given to you by your health care provider. Make sure you discuss any questions you have with your health care provider. Document Released: 05/28/2016 Document Revised: 05/28/2016 Document Reviewed: 05/28/2016 Elsevier Interactive Patient Education  2018 [employer] Inc[employer]    

## 2017-08-11 NOTE — Progress Notes (Signed)
   PRENATAL VISIT NOTE  Subjective:  Tamara Lowery is a 22 y.o. G1P0 at 8036w4d being seen today for ongoing prenatal care.  She is currently monitored for the following issues for this low-risk pregnancy and has Supervision of normal first pregnancy, antepartum; Low blood hemoglobin A2; and Excessive weight gain on their problem list.  Patient reports no complaints.  Contractions: Not present. Vag. Bleeding: None.  Movement: Present. Denies leaking of fluid.   The following portions of the patient's history were reviewed and updated as appropriate: allergies, current medications, past family history, past medical history, past social history, past surgical history and problem list. Problem list updated.  Objective:   Vitals:   08/11/17 1158  BP: 106/68  Pulse: 88  Weight: 220 lb 11.2 oz (100.1 kg)    Fetal Status: Fetal Heart Rate (bpm): 152 Fundal Height: 32 cm Movement: Present     General:  Alert, oriented and cooperative. Patient is in no acute distress.  Skin: Skin is warm and dry. No rash noted.   Cardiovascular: Normal heart rate noted  Respiratory: Normal respiratory effort, no problems with respiration noted  Abdomen: Soft, gravid, appropriate for gestational age.  Pain/Pressure: Absent     Pelvic: Cervical exam deferred        Extremities: Normal range of motion.  Edema: Trace  Mental Status: Normal mood and affect. Normal behavior. Normal judgment and thought content.   Assessment and Plan:  Pregnancy: G1P0 at 10136w4d  1. Supervision of normal first pregnancy, antepartum - Doing well, no complaints  2. Excessive weight gain - 0.5# from last visit  Preterm labor symptoms and general obstetric precautions including but not limited to vaginal bleeding, contractions, leaking of fluid and fetal movement were reviewed in detail with the patient. Please refer to After Visit Summary for other counseling recommendations.  Return in about 2 weeks (around 08/25/2017)  for LOB.  Future Appointments  Date Time Provider Department Center  08/25/2017 11:15 AM Judeth HornLawrence, Erin[facility]    Vonzella NippleJulie Wenzel, PA-C

## 2017-08-19 ENCOUNTER — Encounter: Payer: Self-pay | Admitting: Family Medicine

## 2017-08-19 DIAGNOSIS — O24410 Gestational diabetes mellitus in pregnancy, diet controlled: Secondary | ICD-10-CM

## 2017-08-19 HISTORY — DX: Gestational diabetes mellitus in pregnancy, diet controlled: O24.410

## 2017-08-23 ENCOUNTER — Telehealth: Payer: Self-pay | Admitting: General Practice

## 2017-08-23 NOTE — Telephone Encounter (Signed)
Called patient, no answer- left message to call us back. Will send mychart message also.

## 2017-08-23 NOTE — Telephone Encounter (Signed)
-----   Message from Reva Boresanya S Pratt, MD sent at 08/19/2017 10:02 AM EDT ----- Has not logged any CBGs into [employer]. Can we call and ask about CBGs?--not scheduled for return until 7/31--also seeing Tamara Lowery, but will need MD due to HR

## 2017-08-25 ENCOUNTER — Ambulatory Visit (INDEPENDENT_AMBULATORY_CARE_PROVIDER_SITE_OTHER): Payer: Medicaid Other | Admitting: Student

## 2017-08-25 VITALS — BP 120/70 | HR 105 | Wt 223.0 lb

## 2017-08-25 DIAGNOSIS — O24410 Gestational diabetes mellitus in pregnancy, diet controlled: Secondary | ICD-10-CM

## 2017-08-25 DIAGNOSIS — Z3400 Encounter for supervision of normal first pregnancy, unspecified trimester: Secondary | ICD-10-CM

## 2017-08-25 LAB — GLUCOSE, CAPILLARY: GLUCOSE-CAPILLARY: 116 mg/dL — AB (ref 70–99)

## 2017-08-25 NOTE — Progress Notes (Signed)
    PRENATAL VISIT NOTE  Subjective:  Tamara Lowery is a 22 y.o. G1P0 at 29w4dbeing seen today for ongoing prenatal care.  She is currently monitored for the following issues for this high-risk pregnancy and has Supervision of normal first pregnancy, antepartum; Low blood hemoglobin A2; Excessive weight gain; and Gestational diabetes on their problem list.  Patient reports reports that her allergies are acting up. Has sore throat, postnasal drainage, and a non productive cough. hasn't treated symptoms. No fever/chills. Started yesterday. .  Contractions: Irritability. Vag. Bleeding: None.  Movement: Present. Denies leaking of fluid.   The following portions of the patient's history were reviewed and updated as appropriate: allergies, current medications, past family history, past medical history, past social history, past surgical history and problem list. Problem list updated.  Objective:   Vitals:   08/25/17 1113  BP: 120/70  Pulse: (!) 105  Weight: 223 lb (101.2 kg)    Fetal Status: Fetal Heart Rate (bpm): 137   Movement: Present     General:  Alert, oriented and cooperative. Patient is in no acute distress.  Skin: Skin is warm and dry. No rash noted.   Cardiovascular: Normal heart rate noted  Respiratory: Normal respiratory effort, no problems with respiration noted  Abdomen: Soft, gravid, appropriate for gestational age.  Pain/Pressure: Present     Pelvic: Cervical exam deferred        Extremities: Normal range of motion.  Edema: Trace  HENT  throat- no erythema, exudate, or edema. + postnasal drainage  Mental Status: Normal mood and affect. Normal behavior. Normal judgment and thought content.   Assessment and Plan:  Pregnancy: G1P0 at 357w4d1. Supervision of normal first pregnancy, antepartum   2. Diet controlled gestational diabetes mellitus (GDM) in third trimester - Has met with diabetes educator since diagnosis. Hasn't been checking her blood sugars at  home because she forgets. Does have testing supplies.  -CBG in office 119. Last meal last night but has been drinking gatorade this morning -Stressed importance of BS control in pregnancy & possible complications, including but not limited to, fetal death -Will bring pt back in 1 week to see provider and review BS log  Preterm labor symptoms and general obstetric precautions including but not limited to vaginal bleeding, contractions, leaking of fluid and fetal movement were reviewed in detail with the patient. Please refer to After Visit Summary for other counseling recommendations.  Return in about 1 week (around 09/01/2017) for High Risk OB & to review blood sugar log.  Future Appointments  Date Time Provider [place]8/14/2019  2:35 PM WeLuvenia ReddenPA-C [facility]    ErJorje GuildNP

## 2017-08-25 NOTE — Patient Instructions (Addendum)
Gestational Diabetes Mellitus, Self Care Caring for yourself after you have been diagnosed with gestational diabetes (gestational diabetes mellitus) means keeping your blood sugar (glucose) under control with a balance of:  Nutrition.  Exercise.  Lifestyle changes.  Medicines or insulin, if necessary.  Support from your team of health care providers and others.  The following information explains what you need to know to manage your gestational diabetes at home. What do I need to do to manage my blood glucose?  Check your blood glucose every day during your pregnancy. Do this as often as told by your health care provider.  Contact your health care provider if your blood glucose is above your target for 2 tests in a row. Your health care provider will set individualized treatment goals for you. Generally, the goal of treatment is to maintain the following blood glucose levels during pregnancy:  After not eating for 8 hours (after fasting): at or below 95 mg/dL (5.3 mmol/L).  After meals (postprandial): ? One hour after a meal: at or below 140 mg/dL (7.8 mmol/L). ? Two hours after a meal: at or below 120 mg/dL (6.7 mmol/L).  A1c (hemoglobin A1c) level: 6-6.5%.  What do I need to know about hyperglycemia and hypoglycemia? What is hyperglycemia? Hyperglycemia, also called high blood glucose, occurs when blood glucose is too high. Make sure you know the early signs of hyperglycemia, such as:  Increased thirst.  Hunger.  Feeling very tired.  Needing to urinate more often than usual.  Blurry vision.  What is hypoglycemia? Hypoglycemia, also called low blood glucose, occurswith a blood glucose level at or below 70 mg/dL (3.9 mmol/L). The risk for hypoglycemia increases during or after exercise, during sleep, during illness, and when skipping meals or not eating for a long time (fasting). It is important to know the symptoms of hypoglycemia and treat it right away. Always have a  15-gram rapid-acting carbohydrate snack with you to treat low blood glucose.Family members and close friends should also know the symptoms and should understand how to treat hypoglycemia, in case you are not able to treat yourself. What are the symptoms of hypoglycemia? Hypoglycemia symptoms can include:  Hunger.  Anxiety.  Sweating and feeling clammy.  Confusion.  Dizziness or feeling light-headed.  Sleepiness.  Nausea.  Increased heart rate.  Headache.  Blurry vision.  Seizure.  Nightmares.  Tingling or numbness around the mouth, lips, or tongue.  A change in speech.  Decreased ability to concentrate.  A change in coordination.  Restless sleep.  Tremors or shakes.  Fainting.  Irritability.  How do I treat hypoglycemia?  If you are alert and able to swallow safely, follow the 15:15 rule:  Take 15 grams of a rapid-acting carbohydrate. Rapid-acting options include: ? 1 tube of glucose gel. ? 3 glucose pills. ? 6-8 pieces of hard candy. ? 4 oz (120 mL) of fruit juice. ? 4 oz (120 mL) of regular (not diet) soda.  Check your blood glucose 15 minutes after you take the carbohydrate.  If the repeat blood glucose level is still at or below 70 mg/dL (3.9 mmol/L), take 15 grams of a carbohydrate again.  If your blood glucose level does not increase above 70 mg/dL (3.9 mmol/L) after 3 tries, seek emergency medical care.  After your blood glucose level returns to normal, eat a meal or a snack within 1 hour.  How do I treat severe hypoglycemia? Severe hypoglycemia is when your blood glucose level is at or below 54 mg/dL (  3 mmol/L). Severe hypoglycemia is an emergency. Do not wait to see if the symptoms will go away. Get medical help right away. Call your local emergency services (911 in the [place] Do not drive yourself to the hospital. If you have severe hypoglycemia and you cannot eat or drink, you may need an injection of glucagon. A family member or close  friend should learn how to check your blood glucose and how to give you a glucagon injection. Ask your health care provider if you need to have an emergency glucagon injection kit available. Severe hypoglycemia may need to be treated in a hospital. The treatment may include getting glucose through an IV tube. You may also need treatment for the cause of your hypoglycemia. What else can I do to manage my gestational diabetes? Take your diabetes medicines as told  If your health care provider prescribed insulin or diabetes medicines, take them every day.  Do not run out of insulin or other diabetes medicines that you take. Plan ahead so you always have these available.  If you use insulin, adjust your dosage based on how physically active you are and what foods you eat. Your health care provider will tell you how to adjust your dosage. Make healthy food choices  The things that you eat and drink affect your blood glucose. Making good choices helps to control your diabetes and prevent other health problems. A healthy meal plan includes eating lean proteins, complex carbohydrates, fresh fruits and vegetables, low-fat dairy products, and healthy fats. Make an appointment to see a diet and nutrition specialist (registered dietitian) to help you create an eating plan that is right for you. Make sure that you:  Follow instructions from your health care provider about eating or drinking restrictions.  Drink enough fluid to keep your urine clear or pale yellow.  Eat healthy snacks between nutritious meals.  Track the carbohydrates that you eat. Do this by reading food labels and learning the standard serving sizes of foods.  Follow your sick day plan whenever you cannot eat or drink as usual. Make this plan in advance with your health care provider.  Stay active   Do at least 30 minutes of physical activity a day, or as much physical activity as your health care provider recommends during your  pregnancy. ? Doing 10 minutes of exercise 30 minutes after each meal may help to control postprandial blood glucose levels.  If you start a new exercise or activity, work with your health care provider to adjust your insulin, medicines, or food intake as needed. Make healthy lifestyle choices  Do not drink alcohol.  Do not use any tobacco products, such as cigarettes, chewing tobacco, and e-cigarettes. If you need help quitting, ask your health care provider.  Learn to manage stress. If you need help with this, ask your health care provider. Care for your body  Keep your immunizations up to date.  Brush your teeth and gums two times a day, and floss at least one time a day.  Visit your dentist at least once every 6 months.  Maintain a healthy weight during your pregnancy. General instructions   Take over-the-counter and prescription medicines only as told by your health care provider.  Talk with your health care provider about your risk for high blood pressure during pregnancy (preeclampsia or eclampsia).  Share your diabetes management plan with people in your workplace, school, and household.  Check your urine for ketones during your pregnancy when you are ill and   as told by your health care provider.  Carry a medical alert card or wear medical alert jewelry.  Ask your health care provider: ? Do I need to meet with a diabetes educator? ? Where can I find a support group for people with diabetes?  Keep all follow-up visits during your pregnancy (prenatal) and after delivery (postnatal) as told by your health care provider. This is important. Get the care that you need after delivery  Have your blood glucose level checked 4-12 weeks after delivery. This is done with an oral glucose tolerance test (OGTT).  Get screened for diabetes at least every 3 years, or as often as told by your health care provider. Where to find more information: To learn more about gestational  diabetes, visit:  American Diabetes Association (ADA): www.diabetes.org/diabetes-basics/gestational  Centers for Disease Control and Prevention (CDC): http://sanchez-watson.com/.pdf  This information is not intended to replace advice given to you by your health care provider. Make sure you discuss any questions you have with your health care provider. Document Released: 05/06/2015 Document Revised: 06/20/2015 Document Reviewed: 02/15/2015 Elsevier Interactive Patient Education  2018 [place] Medications in Pregnancy   Acne: Benzoyl Peroxide Salicylic Acid  Backache/Headache: Tylenol: 2 regular strength every 4 hours OR              2 Extra strength every 6 hours  Colds/Coughs/Allergies: Benadryl (alcohol free) 25 mg every 6 hours as needed Breath right strips Claritin Cepacol throat lozenges Chloraseptic throat spray Cold-Eeze- up to three times per day Cough drops, alcohol free Flonase (by prescription only) Guaifenesin Mucinex Robitussin DM (plain only, alcohol free) Saline nasal spray/drops Sudafed (pseudoephedrine) & Actifed ** use only after [redacted] weeks gestation and if you do not have high blood pressure Tylenol Vicks Vaporub Zinc lozenges Zyrtec   Constipation: Colace Ducolax suppositories Fleet enema Glycerin suppositories Metamucil Milk of magnesia Miralax Senokot Smooth move tea  Diarrhea: Kaopectate Imodium A-D  *NO pepto Bismol  Hemorrhoids: Anusol Anusol HC Preparation H Tucks  Indigestion: Tums Maalox Mylanta Zantac  Pepcid  Insomnia: Benadryl (alcohol free) '25mg'$  every 6 hours as needed Tylenol PM Unisom, no Gelcaps  Leg Cramps: Tums MagGel  Nausea/Vomiting:  Bonine Dramamine Emetrol Ginger extract Sea bands Meclizine  Nausea medication to take during pregnancy:  Unisom (doxylamine succinate 25 mg tablets) Take one tablet daily at bedtime. If symptoms are not adequately controlled,  the dose can be increased to a maximum recommended dose of two tablets daily (1/2 tablet in the morning, 1/2 tablet mid-afternoon and one at bedtime). Vitamin B6 '100mg'$  tablets. Take one tablet twice a day (up to 200 mg per day).  Skin Rashes: Aveeno products Benadryl cream or '25mg'$  every 6 hours as needed Calamine Lotion 1% cortisone cream  Yeast infection: Gyne-lotrimin 7 Monistat 7  Gum/tooth pain: Anbesol  **If taking multiple medications, please check labels to avoid duplicating the same active ingredients **take medication as directed on the label ** Do not exceed 4000 mg of tylenol in 24 hours **Do not take medications that contain aspirin or ibuprofen

## 2017-09-06 ENCOUNTER — Ambulatory Visit (INDEPENDENT_AMBULATORY_CARE_PROVIDER_SITE_OTHER): Payer: Medicaid Other | Admitting: Family Medicine

## 2017-09-06 VITALS — BP 120/67 | HR 82 | Wt 230.6 lb

## 2017-09-06 DIAGNOSIS — O24410 Gestational diabetes mellitus in pregnancy, diet controlled: Secondary | ICD-10-CM

## 2017-09-06 DIAGNOSIS — Z3400 Encounter for supervision of normal first pregnancy, unspecified trimester: Secondary | ICD-10-CM

## 2017-09-06 NOTE — Progress Notes (Signed)
   PRENATAL VISIT NOTE  Subjective:  Tamara Lowery is a 22 y.o. G1P0 at 7041w2d being seen today for ongoing prenatal care.  She is currently monitored for the following issues for this high-risk pregnancy and has Supervision of normal first pregnancy, antepartum; Low blood hemoglobin A2; Excessive weight gain; and Gestational diabetes on their problem list.  Patient reports pelvic pressure.  Contractions: Irritability. Vag. Bleeding: None.  Movement: Present. Denies leaking of fluid.   The following portions of the patient's history were reviewed and updated as appropriate: allergies, current medications, past family history, past medical history, past social history, past surgical history and problem list. Problem list updated.  Objective:   Vitals:   09/06/17 0829  BP: 120/67  Pulse: 82  Weight: 230 lb 9.6 oz (104.6 kg)    Fetal Status: Fetal Heart Rate (bpm): 147 Fundal Height: 35 cm Movement: Present  Presentation: Vertex  General:  Alert, oriented and cooperative. Patient is in no acute distress.  Skin: Skin is warm and dry. No rash noted.   Cardiovascular: Normal heart rate noted  Respiratory: Normal respiratory effort, no problems with respiration noted  Abdomen: Soft, gravid, appropriate for gestational age.  Pain/Pressure: Present     Pelvic: Cervical exam performed Dilation: Closed Effacement (%): 80 Station: -2  Extremities: Normal range of motion.  Edema: Mild pitting, slight indentation  Mental Status: Normal mood and affect. Normal behavior. Normal judgment and thought content.   Assessment and Plan:  Pregnancy: G1P0 at 6241w2d  1. Diet controlled gestational diabetes mellitus (GDM) in third trimester CBG reviewed in meter 112, 121, 89, 114, 106--not a lot of checking, 1 fasting is 112. Advised to get more CBGs, so we could more fully address and treat blood sugars. Safety of baby and pregnancy is dependent upon good glycemic control.--reviewed with patient  and significant other. - US MFM OB FOLLOW UP; Future  2. Supervision of normal first pregnancy, antepartum GC/Chlam testing in July--GBS only today. - Strep Gp B NAA  Term labor symptoms and general obstetric precautions including but not limited to vaginal bleeding, contractions, leaking of fluid and fetal movement were reviewed in detail with the patient. Please refer to After Visit Summary for other counseling recommendations.  Return in 1 week (on 09/13/2017).  Future Appointments  Date Time Provider Department Center  09/13/2017 10:35 AM Reva BoresPratt, Janaisa Birkland S[facility]  09/15/2017 10:45 AM [facility] US 5 [facility]  09/22/2017  2:15 PM [place] BingPickens, Charlie, MD [facility]    Reva Boresanya S Jameyah Fennewald, MD

## 2017-09-06 NOTE — Patient Instructions (Signed)

## 2017-09-08 ENCOUNTER — Encounter: Payer: Self-pay | Admitting: Family Medicine

## 2017-09-08 ENCOUNTER — Encounter: Payer: Medicaid Other | Admitting: Medical

## 2017-09-08 DIAGNOSIS — O99820 Streptococcus B carrier state complicating pregnancy: Secondary | ICD-10-CM | POA: Insufficient documentation

## 2017-09-08 HISTORY — DX: Streptococcus B carrier state complicating pregnancy: O99.820

## 2017-09-08 LAB — STREP GP B NAA: STREP GROUP B AG: POSITIVE — AB

## 2017-09-13 ENCOUNTER — Ambulatory Visit (INDEPENDENT_AMBULATORY_CARE_PROVIDER_SITE_OTHER): Payer: Medicaid Other | Admitting: Family Medicine

## 2017-09-13 VITALS — BP 115/68 | HR 82 | Wt 231.8 lb

## 2017-09-13 DIAGNOSIS — O99820 Streptococcus B carrier state complicating pregnancy: Secondary | ICD-10-CM

## 2017-09-13 DIAGNOSIS — Z3400 Encounter for supervision of normal first pregnancy, unspecified trimester: Secondary | ICD-10-CM

## 2017-09-13 DIAGNOSIS — Z3403 Encounter for supervision of normal first pregnancy, third trimester: Secondary | ICD-10-CM

## 2017-09-13 DIAGNOSIS — O24410 Gestational diabetes mellitus in pregnancy, diet controlled: Secondary | ICD-10-CM

## 2017-09-13 NOTE — Patient Instructions (Signed)

## 2017-09-13 NOTE — Progress Notes (Signed)
   PRENATAL VISIT NOTE  Subjective:  Tamara Lowery is a 22 y.o. G1P0 at 4888w2d being seen today for ongoing prenatal care.  She is currently monitored for the following issues for this high-risk pregnancy and has Supervision of normal first pregnancy, antepartum; Low blood hemoglobin A2; Excessive weight gain; Gestational diabetes; and Group B Streptococcus carrier, +RV culture, currently pregnant on their problem list.  Patient reports no complaints.  Contractions: Irritability. Vag. Bleeding: None.  Movement: Present. Denies leaking of fluid.   The following portions of the patient's history were reviewed and updated as appropriate: allergies, current medications, past family history, past medical history, past social history, past surgical history and problem list. Problem list updated.  Objective:   Vitals:   09/13/17 1047  BP: 115/68  Pulse: 82  Weight: 231 lb 12.8 oz (105.1 kg)    Fetal Status: Fetal Heart Rate (bpm): 132 Fundal Height: 34 cm Movement: Present  Presentation: Vertex  General:  Alert, oriented and cooperative. Patient is in no acute distress.  Skin: Skin is warm and dry. No rash noted.   Cardiovascular: Normal heart rate noted  Respiratory: Normal respiratory effort, no problems with respiration noted  Abdomen: Soft, gravid, appropriate for gestational age.  Pain/Pressure: Present     Pelvic: Cervical exam performed Dilation: 1 Effacement (%): 60 Station: -2  Extremities: Normal range of motion.  Edema: Mild pitting, slight indentation  Mental Status: Normal mood and affect. Normal behavior. Normal judgment and thought content.   Assessment and Plan:  Pregnancy: G1P0 at 3288w2d  1. Diet controlled gestational diabetes mellitus (GDM) in third trimester CBGs 85, 108, 94, 111, 128, 102, 112 Continue diet  2. Supervision of normal first pregnancy, antepartum Continue prenatal care.   3. Group B Streptococcus carrier, +RV culture, currently  pregnant Will need treatment in labor--informed  Term labor symptoms and general obstetric precautions including but not limited to vaginal bleeding, contractions, leaking of fluid and fetal movement were reviewed in detail with the patient. Please refer to After Visit Summary for other counseling recommendations.  Return in 1 week (on 09/20/2017).  Future Appointments  Date Time Provider Department Center  09/15/2017 10:45 AM [facility] US 5 [facility]US M[facility]-US  09/22/2017  2:15 PM [place] BingPickens, Charlie, [facility]    Reva Boresanya S Yaakov Saindon, MD

## 2017-09-15 ENCOUNTER — Ambulatory Visit (HOSPITAL_COMMUNITY)
Admission: RE | Admit: 2017-09-15 | Discharge: 2017-09-15 | Disposition: A | Discharge status: Home / Self Care | Payer: Medicaid Other | Source: Ambulatory Visit | Attending: Family Medicine | Admitting: Family Medicine

## 2017-09-15 DIAGNOSIS — O99013 Anemia complicating pregnancy, third trimester: Secondary | ICD-10-CM | POA: Diagnosis not present

## 2017-09-15 DIAGNOSIS — Z362 Encounter for other antenatal screening follow-up: Secondary | ICD-10-CM | POA: Diagnosis not present

## 2017-09-15 DIAGNOSIS — O09893 Supervision of other high risk pregnancies, third trimester: Secondary | ICD-10-CM | POA: Diagnosis not present

## 2017-09-15 DIAGNOSIS — O24410 Gestational diabetes mellitus in pregnancy, diet controlled: Secondary | ICD-10-CM | POA: Diagnosis present

## 2017-09-15 DIAGNOSIS — Z3A37 37 weeks gestation of pregnancy: Secondary | ICD-10-CM | POA: Diagnosis not present

## 2017-09-22 ENCOUNTER — Ambulatory Visit (INDEPENDENT_AMBULATORY_CARE_PROVIDER_SITE_OTHER): Payer: Medicaid Other | Admitting: Obstetrics and Gynecology

## 2017-09-22 VITALS — BP 123/69 | HR 89 | Wt 236.7 lb

## 2017-09-22 DIAGNOSIS — O0990 Supervision of high risk pregnancy, unspecified, unspecified trimester: Secondary | ICD-10-CM

## 2017-09-22 NOTE — Progress Notes (Signed)
Prenatal Visit Note Date: 09/22/2017 Clinic: [facility]  Subjective:  Tamara Lowery is a 22 y.o. G1P0 at 5917w4d being seen today for ongoing prenatal care.  She is currently monitored for the following issues for this high-risk pregnancy and has Supervision of high risk pregnancy, antepartum; Low blood hemoglobin A2; Excessive weight gain; Gestational diabetes; and Group B Streptococcus carrier, +RV culture, currently pregnant on their problem list.  Patient reports no complaints.   Contractions: Irregular. Vag. Bleeding: None.  Movement: Present. Denies leaking of fluid.   The following portions of the patient's history were reviewed and updated as appropriate: allergies, current medications, past family history, past medical history, past social history, past surgical history and problem list. Problem list updated.  Objective:   Vitals:   09/22/17 1450  BP: 123/69  Pulse: 89  Weight: 236 lb 11.2 oz (107.4 kg)    Fetal Status: Fetal Heart Rate (bpm): 134   Movement: Present  Presentation: Vertex  General:  Alert, oriented and cooperative. Patient is in no acute distress.  Skin: Skin is warm and dry. No rash noted.   Cardiovascular: Normal heart rate noted  Respiratory: Normal respiratory effort, no problems with respiration noted  Abdomen: Soft, gravid, appropriate for gestational age. Pain/Pressure: Present     Pelvic:  Cervical exam performed Dilation: 1 Effacement (%): 50 Station: Ballotable  Extremities: Normal range of motion.  Edema: Mild pitting, slight indentation  Mental Status: Normal mood and affect. Normal behavior. Normal judgment and thought content.   Urinalysis:      Assessment and Plan:  Pregnancy: G1P0 at 3417w4d  Routine care. Normal growth u/s a week ago. Normal BS log but not checking a lot of values. I told her to try and check qid for more data. [facility]  Preterm labor symptoms and general obstetric  precautions including but not limited to vaginal bleeding, contractions, leaking of fluid and fetal movement were reviewed in detail with the patient. Please refer to After Visit Summary for other counseling recommendations.  Return in about 1 week (around 09/29/2017) for hrob.   [place] BingPickens, Tamara Pavlich, MD

## 2017-09-29 ENCOUNTER — Telehealth (HOSPITAL_COMMUNITY): Payer: Self-pay | Admitting: *Deleted

## 2017-09-29 ENCOUNTER — Encounter (HOSPITAL_COMMUNITY): Payer: Self-pay | Admitting: *Deleted

## 2017-09-29 ENCOUNTER — Ambulatory Visit (INDEPENDENT_AMBULATORY_CARE_PROVIDER_SITE_OTHER): Payer: Self-pay | Admitting: Obstetrics & Gynecology

## 2017-09-29 VITALS — BP 124/72 | HR 72 | Wt 237.3 lb

## 2017-09-29 DIAGNOSIS — O99820 Streptococcus B carrier state complicating pregnancy: Secondary | ICD-10-CM

## 2017-09-29 DIAGNOSIS — O0990 Supervision of high risk pregnancy, unspecified, unspecified trimester: Secondary | ICD-10-CM

## 2017-09-29 DIAGNOSIS — O24410 Gestational diabetes mellitus in pregnancy, diet controlled: Secondary | ICD-10-CM

## 2017-09-29 NOTE — Patient Instructions (Addendum)
Induction of labor on Saturday 10/02/17 at 0730   Labor Induction Labor induction is when steps are taken to cause a pregnant woman to begin the labor process. Most women go into labor on their own between 37 weeks and 42 weeks of the pregnancy. When this does not happen or when there is a medical need, methods may be used to induce labor. Labor induction causes a pregnant woman's uterus to contract. It also causes the cervix to soften (ripen), open (dilate), and thin out (efface). Usually, labor is not induced before 39 weeks of the pregnancy unless there is a problem with the baby or mother. Before inducing labor, your health care provider will consider a number of factors, including the following:  The medical condition of you and the baby.  How many weeks along you are.  The status of the baby's lung maturity.  The condition of the cervix.  The position of the baby.  What are the reasons for labor induction? Labor may be induced for the following reasons:  The health of the baby or mother is at risk.  The pregnancy is overdue by 1 week or more.  The water breaks but labor does not start on its own.  The mother has a health condition or serious illness, such as high blood pressure, infection, placental abruption, or diabetes.  The amniotic fluid amounts are low around the baby.  The baby is distressed.  Convenience or wanting the baby to be born on a certain date is not a reason for inducing labor. What methods are used for labor induction? Several methods of labor induction may be used, such as:  Prostaglandin medicine. This medicine causes the cervix to dilate and ripen. The medicine will also start contractions. It can be taken by mouth or by inserting a suppository into the vagina.  Inserting a thin tube (catheter) with a balloon on the end into the vagina to dilate the cervix. Once inserted, the balloon is expanded with water, which causes the cervix to open.  Stripping  the membranes. Your health care provider separates amniotic sac tissue from the cervix, causing the cervix to be stretched and causing the release of a hormone called progesterone. This may cause the uterus to contract. It is often done during an office visit. You will be sent home to wait for the contractions to begin. You will then come in for an induction.  Breaking the water. Your health care provider makes a hole in the amniotic sac using a small instrument. Once the amniotic sac breaks, contractions should begin. This may still take hours to see an effect.  Medicine to trigger or strengthen contractions. This medicine is given through an IV access tube inserted into a vein in your arm.  All of the methods of induction, besides stripping the membranes, will be done in the hospital. Induction is done in the hospital so that you and the baby can be carefully monitored. How long does it take for labor to be induced? Some inductions can take up to 2-3 days. Depending on the cervix, it usually takes less time. It takes longer when you are induced early in the pregnancy or if this is your first pregnancy. If a mother is still pregnant and the induction has been going on for 2-3 days, either the mother will be sent home or a cesarean delivery will be needed. What are the risks associated with labor induction? Some of the risks of induction include:  Changes in fetal heart  rate, such as too high, too low, or erratic.  Fetal distress.  Chance of infection for the mother and baby.  Increased chance of having a cesarean delivery.  Breaking off (abruption) of the placenta from the uterus (rare).  Uterine rupture (very rare).  When induction is needed for medical reasons, the benefits of induction may outweigh the risks. What are some reasons for not inducing labor? Labor induction should not be done if:  It is shown that your baby does not tolerate labor.  You have had previous surgeries on  your uterus, such as a myomectomy or the removal of fibroids.  Your placenta lies very low in the uterus and blocks the opening of the cervix (placenta previa).  Your baby is not in a head-down position.  The umbilical cord drops down into the birth canal in front of the baby. This could cut off the baby's blood and oxygen supply.  You have had a previous cesarean delivery.  There are unusual circumstances, such as the baby being extremely premature.  This information is not intended to replace advice given to you by your health care provider. Make sure you discuss any questions you have with your health care provider. Document Released: 06/03/2006 Document Revised: 06/20/2015 Document Reviewed: 08/11/2012 Elsevier Interactive Patient Education  2017 [employer].

## 2017-09-29 NOTE — Progress Notes (Signed)
PRENATAL VISIT NOTE  Subjective:  Tamara Lowery is a 22 y.o. G1P0 at [redacted]w[redacted]d being seen today for ongoing prenatal care.  She is currently monitored for the following issues for this high-risk pregnancy and has Supervision of high risk pregnancy, antepartum; Low blood hemoglobin A2; Excessive weight gain; GDM (gestational diabetes mellitus), class A1; and Group B Streptococcus carrier, +RV culture, currently pregnant on their problem list.  Patient reports occasional contractions.  Contractions: Irregular. Vag. Bleeding: None.  Movement: Present. Denies leaking of fluid.   The following portions of the patient's history were reviewed and updated as appropriate: allergies, current medications, past family history, past medical history, past social history, past surgical history and problem list. Problem list updated.  Objective:   Vitals:   09/29/17 0836  BP: 124/72  Pulse: 72  Weight: 237 lb 4.8 oz (107.6 kg)    Fetal Status: Fetal Heart Rate (bpm): 128 Fundal Height: 39 cm Movement: Present  Presentation: Vertex  General:  Alert, oriented and cooperative. Patient is in no acute distress.  Skin: Skin is warm and dry. No rash noted.   Cardiovascular: Normal heart rate noted  Respiratory: Normal respiratory effort, no problems with respiration noted  Abdomen: Soft, gravid, appropriate for gestational age.  Pain/Pressure: Present     Pelvic: Cervical exam performed Dilation: 1 Effacement (%): 50 Station: Ballotable  Extremities: Normal range of motion.  Edema: Mild pitting, slight indentation  Mental Status: Normal mood and affect. Normal behavior. Normal judgment and thought content.  [place] [facility] Ob Follow Up  Result Date: 09/15/2017 ----------------------------------------------------------------------  OBSTETRICS REPORT                       (Signed Final 09/15/2017 02:54 pm) ---------------------------------------------------------------------- Patient Info  ID #:        [id number]                          D.O.B.:  03/26/1995 (21 yrs)  Name:       Tamara Lowery-            Visit Date: 09/15/2017 10:58 am              TAPIA ---------------------------------------------------------------------- Performed By  Performed By:     Lenise Arena        Ref. Address:     [address]                                                             [place]                                                             [place] [address]  Attending:        Noralee Space MD        Location:         [facility]  Referred By:      Nat Christen  KOO[place]RA CNM ---------------------------------------------------------------------- Orders   #  Description                          Code         Ordered By   1  [place] [facility] OB FOLLOW UP                  [id number]     TANYA PRATT  ----------------------------------------------------------------------   #  Order #                    Accession #                 Episode #   1  [id number]                  [id number]                  [id number]  ---------------------------------------------------------------------- Indications   [redacted] weeks gestation of pregnancy                Z3A.37   Antenatal follow-up for nonvisualized fetal    Z36.2   anatomy   Medical complication of pregnancy ( low Hgb    O26.90   A2, low risk NIPS)  ---------------------------------------------------------------------- Vital Signs  Weight (lb): 169                               Height:        5'7"  BMI:         26.47 ---------------------------------------------------------------------- Fetal Evaluation  Num Of Fetuses:         1  Fetal Heart Rate(bpm):  123  Cardiac Activity:       Observed  Presentation:           Cephalic  Placenta:               Anterior  P. Cord Insertion:      Previously Visualized  Amniotic Fluid  AFI FV:      Within normal limits  AFI Sum(cm)     %Tile       Largest Pocket(cm)  13.73           52          5.16  RUQ(cm)        RLQ(cm)       LUQ(cm)        LLQ(cm)  5.16          3.85          0              4.72 ---------------------------------------------------------------------- Biometry  BPD:      85.1  mm     G. Age:  34w 2d          3  %    CI:        76.31   %    70 - 86                                                          FL/HC:      23.4   %    20.9 - 22.7  HC:  308.7  mm     G. Age:  34w 3d        < 3  %    HC/AC:      0.92        0.92 - 1.05  AC:      334.4  mm     G. Age:  37w 2d         59  %    FL/BPD:     84.8   %    71 - 87  FL:       72.2  mm     G. Age:  37w 0d         36  %    FL/AC:      21.6   %    20 - 24  LV:        6.1  mm  Est. FW:    2971  gm      6 lb 9 oz     51  % ---------------------------------------------------------------------- OB History  Gravidity:    1 ---------------------------------------------------------------------- Gestational Age  LMP:           37w 4d        Date:  12/26/16                 EDD:   10/02/17  U/S Today:     35w 5d                                        EDD:   10/15/17  Best:          37w 4d     Det. By:  LMP  (12/26/16)          EDD:   10/02/17 ---------------------------------------------------------------------- Anatomy  Cranium:               Appears normal         Aortic Arch:            Previously seen  Cavum:                 Appears normal         Ductal Arch:            Previously seen  Ventricles:            Appears normal         Diaphragm:              Appears normal  Choroid Plexus:        Previously seen        Stomach:                Appears normal, left                                                                        sided  Cerebellum:            Previously seen        Abdomen:                Appears normal  Posterior Fossa:  Previously seen        Abdominal Wall:         Previously seen  Nuchal Fold:           Previously seen        Cord Vessels:           Previously seen  Face:                  Orbits and profile     Kidneys:                 Appear normal                         previously seen  Lips:                  Appears normal         Bladder:                Appears normal  Thoracic:              Appears normal         Spine:                  Previously seen  Heart:                 Appears normal         Upper Extremities:      Previously seen                         (4CH, axis, and situs  RVOT:                  Previously seen        Lower Extremities:      Previously seen  LVOT:                  Previously seen  Other:  Female gender. Heels previously visualized. Technically difficult due          to maternal habitus and fetal position. ---------------------------------------------------------------------- Cervix Uterus Adnexa  Cervix  Not visualized (advanced GA >24wks) ---------------------------------------------------------------------- Impression  Amniotic fluid is normal and good fetal activity is seen. Fetal  growth is appropriate for gestational age. Head  circumference measurement is at between -1 and -2 SD  (normal). ---------------------------------------------------------------------- Recommendations  Follow-up scans as clinically indicated. ----------------------------------------------------------------------                  Noralee Space, MD Electronically Signed Final Report   09/15/2017 02:54 pm ----------------------------------------------------------------------   Assessment and Plan:  Pregnancy: G1P0 at [redacted]w[redacted]d  1. GDM (gestational diabetes mellitus), class A1 Stable CBGs. IOL scheduled 10/02/17 at 0730.   2. Group B Streptococcus carrier, +RV culture, currently pregnant Will treat in labor  3. Supervision of high risk pregnancy, antepartum Term labor symptoms and general obstetric precautions including but not limited to vaginal bleeding, contractions, leaking of fluid and fetal movement were reviewed in detail with the patient. Please refer to After Visit Summary for other counseling recommendations.  Return in  about 3 weeks (around 10/20/2017) for Postpartum check  and  7 weeks from now: Postpartum and 2 hr GTT.  Future Appointments  Date Time Provider Department Center  10/02/2017  7:30 AM [facility] RO[facility] None    Jaynie Collins, MD

## 2017-09-29 NOTE — Telephone Encounter (Signed)
Preadmission screen  

## 2017-10-02 ENCOUNTER — Encounter (HOSPITAL_COMMUNITY): Payer: Self-pay

## 2017-10-02 ENCOUNTER — Other Ambulatory Visit: Payer: Self-pay

## 2017-10-02 ENCOUNTER — Inpatient Hospital Stay (HOSPITAL_COMMUNITY)
Admission: RE | Admit: 2017-10-02 | Discharge: 2017-10-05 | DRG: 807 | Disposition: A | Discharge status: Home / Self Care | Payer: Medicaid Other | Attending: Obstetrics and Gynecology | Admitting: Obstetrics and Gynecology

## 2017-10-02 DIAGNOSIS — O99824 Streptococcus B carrier state complicating childbirth: Secondary | ICD-10-CM | POA: Diagnosis present

## 2017-10-02 DIAGNOSIS — O99820 Streptococcus B carrier state complicating pregnancy: Secondary | ICD-10-CM

## 2017-10-02 DIAGNOSIS — O24429 Gestational diabetes mellitus in childbirth, unspecified control: Secondary | ICD-10-CM | POA: Diagnosis not present

## 2017-10-02 DIAGNOSIS — O2603 Excessive weight gain in pregnancy, third trimester: Secondary | ICD-10-CM | POA: Diagnosis present

## 2017-10-02 DIAGNOSIS — O24420 Gestational diabetes mellitus in childbirth, diet controlled: Secondary | ICD-10-CM | POA: Diagnosis present

## 2017-10-02 DIAGNOSIS — O0990 Supervision of high risk pregnancy, unspecified, unspecified trimester: Secondary | ICD-10-CM

## 2017-10-02 DIAGNOSIS — Z3A40 40 weeks gestation of pregnancy: Secondary | ICD-10-CM

## 2017-10-02 DIAGNOSIS — R635 Abnormal weight gain: Secondary | ICD-10-CM | POA: Diagnosis present

## 2017-10-02 DIAGNOSIS — O24410 Gestational diabetes mellitus in pregnancy, diet controlled: Secondary | ICD-10-CM | POA: Diagnosis present

## 2017-10-02 DIAGNOSIS — Z87891 Personal history of nicotine dependence: Secondary | ICD-10-CM

## 2017-10-02 LAB — CBC
HCT: 36.6 % (ref 36.0–46.0)
Hemoglobin: 12.2 g/dL (ref 12.0–15.0)
MCH: 31.9 pg (ref 26.0–34.0)
MCHC: 33.3 g/dL (ref 30.0–36.0)
MCV: 95.6 fL (ref 78.0–100.0)
PLATELETS: 243 10*3/uL (ref 150–400)
RBC: 3.83 MIL/uL — AB (ref 3.87–5.11)
RDW: 13.7 % (ref 11.5–15.5)
WBC: 12.7 10*3/uL — AB (ref 4.0–10.5)

## 2017-10-02 LAB — TYPE AND SCREEN
ABO/RH(D): A POS
ANTIBODY SCREEN: NEGATIVE

## 2017-10-02 LAB — GLUCOSE, CAPILLARY
GLUCOSE-CAPILLARY: 77 mg/dL (ref 70–99)
Glucose-Capillary: 105 mg/dL — ABNORMAL HIGH (ref 70–99)
Glucose-Capillary: 79 mg/dL (ref 70–99)

## 2017-10-02 LAB — ABO/RH: ABO/RH(D): A POS

## 2017-10-02 LAB — RPR: RPR Ser Ql: NONREACTIVE

## 2017-10-02 MED ORDER — TERBUTALINE SULFATE 1 MG/ML IJ SOLN
0.2500 mg | Freq: Once | INTRAMUSCULAR | Status: AC | PRN
  Administered 2017-10-03: 0.25 mg via SUBCUTANEOUS
  Filled 2017-10-02: qty 1

## 2017-10-02 MED ORDER — FENTANYL CITRATE (PF) 100 MCG/2ML IJ SOLN
50.0000 ug | INTRAMUSCULAR | Status: DC | PRN
  Administered 2017-10-02: 50 ug via INTRAVENOUS
  Administered 2017-10-02 – 2017-10-03 (×2): 100 ug via INTRAVENOUS
  Filled 2017-10-02 (×3): qty 2

## 2017-10-02 MED ORDER — OXYCODONE-ACETAMINOPHEN 5-325 MG PO TABS: 1.0000 | ORAL_TABLET | ORAL | Status: DC | PRN

## 2017-10-02 MED ORDER — ZOLPIDEM TARTRATE 5 MG PO TABS: 5.0000 mg | ORAL_TABLET | Freq: Every evening | ORAL | Status: DC | PRN

## 2017-10-02 MED ORDER — DIPHENHYDRAMINE HCL 50 MG/ML IJ SOLN: 12.5000 mg | INTRAMUSCULAR | Status: DC | PRN

## 2017-10-02 MED ORDER — OXYTOCIN 40 UNITS IN LACTATED RINGERS INFUSION - SIMPLE MED
2.5000 [IU]/h | INTRAVENOUS | Status: DC
  Administered 2017-10-03: 2.5 [IU]/h via INTRAVENOUS

## 2017-10-02 MED ORDER — LIDOCAINE HCL (PF) 1 % IJ SOLN
30.0000 mL | INTRAMUSCULAR | Status: DC | PRN
  Filled 2017-10-02: qty 30

## 2017-10-02 MED ORDER — MISOPROSTOL 25 MCG QUARTER TABLET
25.0000 ug | ORAL_TABLET | ORAL | Status: DC | PRN
  Filled 2017-10-02: qty 1

## 2017-10-02 MED ORDER — OXYTOCIN 40 UNITS IN LACTATED RINGERS INFUSION - SIMPLE MED: 1.0000 m[IU]/min | INTRAVENOUS | Status: DC

## 2017-10-02 MED ORDER — OXYCODONE-ACETAMINOPHEN 5-325 MG PO TABS: 2.0000 | ORAL_TABLET | ORAL | Status: DC | PRN

## 2017-10-02 MED ORDER — ONDANSETRON HCL 4 MG/2ML IJ SOLN: 4.0000 mg | Freq: Four times a day (QID) | INTRAMUSCULAR | Status: DC | PRN

## 2017-10-02 MED ORDER — FENTANYL 2.5 MCG/ML BUPIVACAINE 1/10 % EPIDURAL INFUSION (WH - ANES)
14.0000 mL/h | INTRAMUSCULAR | Status: DC | PRN
  Administered 2017-10-03: 14 mL/h via EPIDURAL
  Filled 2017-10-02: qty 100

## 2017-10-02 MED ORDER — MISOPROSTOL 50MCG HALF TABLET
50.0000 ug | ORAL_TABLET | ORAL | Status: DC | PRN
  Administered 2017-10-02 (×3): 50 ug via ORAL
  Filled 2017-10-02 (×4): qty 1

## 2017-10-02 MED ORDER — HYDROXYZINE HCL 50 MG PO TABS
50.0000 mg | ORAL_TABLET | Freq: Four times a day (QID) | ORAL | Status: DC | PRN
  Filled 2017-10-02: qty 1

## 2017-10-02 MED ORDER — LACTATED RINGERS IV SOLN
INTRAVENOUS | Status: DC
  Administered 2017-10-02: 125 mL/h via INTRAVENOUS

## 2017-10-02 MED ORDER — EPHEDRINE 5 MG/ML INJ
10.0000 mg | INTRAVENOUS | Status: DC | PRN
  Filled 2017-10-02: qty 2

## 2017-10-02 MED ORDER — ACETAMINOPHEN 325 MG PO TABS
650.0000 mg | ORAL_TABLET | ORAL | Status: DC | PRN
  Filled 2017-10-02: qty 2

## 2017-10-02 MED ORDER — OXYTOCIN 40 UNITS IN LACTATED RINGERS INFUSION - SIMPLE MED
1.0000 m[IU]/min | INTRAVENOUS | Status: DC
  Administered 2017-10-02: 2 m[IU]/min via INTRAVENOUS
  Filled 2017-10-02: qty 1000

## 2017-10-02 MED ORDER — FLEET ENEMA 7-19 GM/118ML RE ENEM: 1.0000 | ENEMA | Freq: Every day | RECTAL | Status: DC | PRN

## 2017-10-02 MED ORDER — LACTATED RINGERS IV SOLN: 500.0000 mL | Freq: Once | INTRAVENOUS | Status: DC

## 2017-10-02 MED ORDER — PENICILLIN G 3 MILLION UNITS IVPB - SIMPLE MED
3.0000 10*6.[IU] | INTRAVENOUS | Status: DC
  Administered 2017-10-02 – 2017-10-03 (×5): 3 10*6.[IU] via INTRAVENOUS
  Filled 2017-10-02 (×2): qty 100
  Filled 2017-10-02: qty 3
  Filled 2017-10-02 (×2): qty 100
  Filled 2017-10-02: qty 3
  Filled 2017-10-02 (×4): qty 100
  Filled 2017-10-02: qty 3
  Filled 2017-10-02: qty 100
  Filled 2017-10-02: qty 3
  Filled 2017-10-02 (×3): qty 100

## 2017-10-02 MED ORDER — LACTATED RINGERS IV SOLN
500.0000 mL | INTRAVENOUS | Status: DC | PRN
  Administered 2017-10-03: 500 mL via INTRAVENOUS

## 2017-10-02 MED ORDER — PHENYLEPHRINE 40 MCG/ML (10ML) SYRINGE FOR IV PUSH (FOR BLOOD PRESSURE SUPPORT)
80.0000 ug | PREFILLED_SYRINGE | INTRAVENOUS | Status: DC | PRN
  Filled 2017-10-02: qty 5

## 2017-10-02 MED ORDER — SODIUM CHLORIDE 0.9 % IV SOLN
5.0000 10*6.[IU] | Freq: Once | INTRAVENOUS | Status: AC
  Administered 2017-10-02: 5 10*6.[IU] via INTRAVENOUS
  Filled 2017-10-02: qty 5

## 2017-10-02 MED ORDER — SOD CITRATE-CITRIC ACID 500-334 MG/5ML PO SOLN
30.0000 mL | ORAL | Status: DC | PRN
  Administered 2017-10-03: 30 mL via ORAL
  Filled 2017-10-02: qty 15

## 2017-10-02 MED ORDER — PHENYLEPHRINE 40 MCG/ML (10ML) SYRINGE FOR IV PUSH (FOR BLOOD PRESSURE SUPPORT)
80.0000 ug | PREFILLED_SYRINGE | INTRAVENOUS | Status: DC | PRN
  Filled 2017-10-02: qty 10
  Filled 2017-10-02: qty 5

## 2017-10-02 MED ORDER — OXYTOCIN BOLUS FROM INFUSION
500.0000 mL | Freq: Once | INTRAVENOUS | Status: AC
  Administered 2017-10-03: 500 mL via INTRAVENOUS

## 2017-10-02 MED ORDER — TERBUTALINE SULFATE 1 MG/ML IJ SOLN: 0.2500 mg | Freq: Once | INTRAMUSCULAR | Status: DC | PRN

## 2017-10-02 NOTE — Progress Notes (Signed)
Tamara Lowery is a 22 y.o. G1P0 at [redacted]w[redacted]d by LMP admitted for induction of labor due to A1GDM.  Subjective: Pt comfortable, feeling irregular mild contractions.  Objective: BP 131/80   Pulse 77   Temp 98 F (36.7 C) (Oral)   Resp 16   Ht 5\' 7"  (1.702 m)   Wt 107.7 kg   LMP 12/26/2016 (Exact Date)   BMI 37.20 kg/m  No intake/output data recorded. No intake/output data recorded.  FHT:  FHR: 135 bpm, variability: moderate,  accelerations:  Present,  decelerations:  Absent UC:   regular, every 2-3 minutes SVE:   Dilation: 2 Effacement (%): 50 Station: -3 Exam by:: Kris Hartmann, RN FB placed by CNM, filled to 60 ml by RN. Pt tolerated well.    Labs: Lab Results  Component Value Date   WBC 12.7 (H) 10/02/2017   HGB 12.2 10/02/2017   HCT 36.6 10/02/2017   MCV 95.6 10/02/2017   PLT 243 10/02/2017    Assessment / Plan: Induction of labor due to gestational diabetes Foley Bulb placed, Cytotec to continue Q 4h  Labor: Progressing normally Preeclampsia:  n/a Fetal Wellbeing:  Category I Pain Control:  Labor support without medications I/D:  GBS positive on PCN Anticipated MOD:  NSVD  Sharen Counter 10/02/2017, 5:35 PM

## 2017-10-02 NOTE — Anesthesia Pain Management Evaluation Note (Signed)
  CRNA Pain Management Visit Note  Patient: Tamara Lowery, 22 y.o., female  "Hello I am a member of the anesthesia team at [facility]. We have an anesthesia team available at all times to provide care throughout the hospital, including epidural management and anesthesia for C-section. I don't know your plan for the delivery whether it a natural birth, water birth, IV sedation, nitrous supplementation, doula or epidural, but we want to meet your pain goals."   1.Was your pain managed to your expectations on prior hospitalizations?   No prior hospitalizations  2.What is your expectation for pain management during this hospitalization?     Epidural  3.How can we help you reach that goal? unsure  Record the patient's initial score and the patient's pain goal.   Pain: 6  Pain Goal: 8 [facility] wants you to be able to say your pain was always managed very well.  Cephus Shelling 10/02/2017

## 2017-10-02 NOTE — Progress Notes (Signed)
Labor Progress Note Tamara Lowery is a 22 y.o. G1P0 at [redacted]w[redacted]d presented for induction of labor due to A1GDM. S: Doing well. Contractions have decreased in intensity. Noticed some bloody show when she went to the bathroom. Foley bulb fell out around 21:40.  O:  BP 133/70   Pulse 74   Temp 98.3 F (36.8 C) (Oral)   Resp 18   Ht 5\' 7"  (1.702 m)   Wt 107.7 kg   LMP 12/26/2016 (Exact Date)   BMI 37.20 kg/m  EFM: 120/mod var/+accels, no decels  CTX q2-79min  CVE: Dilation: 4 Effacement (%): 50 Cervical Position: Posterior Station: -3 Presentation: Vertex Exam by:: Dr. Fara Boros   A&P: 22 y.o. G1P0 [redacted]w[redacted]d for induction of labor due to A1GDM. #Labor: Progressing well. S/p cytotec x3. Foley bulb fell out around 21:40. Stat Pit. #Pain: well controlled w/ IV fentayl for now. Considering epidural.  #FWB: Cat 1 FHT. #GBS positive- on PCN   Denzil Hughes, MD 10:58 PM

## 2017-10-03 ENCOUNTER — Inpatient Hospital Stay (HOSPITAL_COMMUNITY): Payer: Medicaid Other | Admitting: Anesthesiology

## 2017-10-03 DIAGNOSIS — O24429 Gestational diabetes mellitus in childbirth, unspecified control: Secondary | ICD-10-CM

## 2017-10-03 DIAGNOSIS — Z3A40 40 weeks gestation of pregnancy: Secondary | ICD-10-CM

## 2017-10-03 DIAGNOSIS — O99824 Streptococcus B carrier state complicating childbirth: Secondary | ICD-10-CM

## 2017-10-03 LAB — GLUCOSE, CAPILLARY
GLUCOSE-CAPILLARY: 87 mg/dL (ref 70–99)
GLUCOSE-CAPILLARY: 87 mg/dL (ref 70–99)
Glucose-Capillary: 91 mg/dL (ref 70–99)
Glucose-Capillary: 92 mg/dL (ref 70–99)

## 2017-10-03 LAB — CBC
HCT: 35.6 % — ABNORMAL LOW (ref 36.0–46.0)
Hemoglobin: 11.8 g/dL — ABNORMAL LOW (ref 12.0–15.0)
MCH: 32.1 pg (ref 26.0–34.0)
MCHC: 33.1 g/dL (ref 30.0–36.0)
MCV: 96.7 fL (ref 78.0–100.0)
PLATELETS: 217 10*3/uL (ref 150–400)
RBC: 3.68 MIL/uL — ABNORMAL LOW (ref 3.87–5.11)
RDW: 13.7 % (ref 11.5–15.5)
WBC: 20.9 10*3/uL — AB (ref 4.0–10.5)

## 2017-10-03 MED ORDER — IBUPROFEN 600 MG PO TABS
600.0000 mg | ORAL_TABLET | Freq: Four times a day (QID) | ORAL | Status: DC
  Administered 2017-10-03 – 2017-10-05 (×7): 600 mg via ORAL
  Filled 2017-10-03 (×7): qty 1

## 2017-10-03 MED ORDER — OXYTOCIN 40 UNITS IN LACTATED RINGERS INFUSION - SIMPLE MED: 2.5000 [IU]/h | INTRAVENOUS | Status: DC | PRN

## 2017-10-03 MED ORDER — TETANUS-DIPHTH-ACELL PERTUSSIS 5-2.5-18.5 LF-MCG/0.5 IM SUSP: 0.5000 mL | Freq: Once | INTRAMUSCULAR | Status: DC

## 2017-10-03 MED ORDER — LACTATED RINGERS AMNIOINFUSION
INTRAVENOUS | Status: DC
  Filled 2017-10-03 (×3): qty 1000

## 2017-10-03 MED ORDER — PRENATAL MULTIVITAMIN CH
1.0000 | ORAL_TABLET | Freq: Every day | ORAL | Status: DC
  Administered 2017-10-04 – 2017-10-05 (×2): 1 via ORAL
  Filled 2017-10-03 (×2): qty 1

## 2017-10-03 MED ORDER — TRANEXAMIC ACID 1000 MG/10ML IV SOLN
1000.0000 mg | Freq: Once | INTRAVENOUS | Status: AC
  Administered 2017-10-03: 1000 mg via INTRAVENOUS

## 2017-10-03 MED ORDER — OXYCODONE HCL 5 MG PO TABS
5.0000 mg | ORAL_TABLET | Freq: Four times a day (QID) | ORAL | Status: DC | PRN
  Administered 2017-10-04 – 2017-10-05 (×4): 5 mg via ORAL
  Filled 2017-10-03 (×4): qty 1

## 2017-10-03 MED ORDER — ACETAMINOPHEN 10 MG/ML IV SOLN
1000.0000 mg | Freq: Once | INTRAVENOUS | Status: AC
  Administered 2017-10-03: 1000 mg via INTRAVENOUS
  Filled 2017-10-03: qty 100

## 2017-10-03 MED ORDER — SIMETHICONE 80 MG PO CHEW: 80.0000 mg | CHEWABLE_TABLET | ORAL | Status: DC | PRN

## 2017-10-03 MED ORDER — ACETAMINOPHEN 325 MG PO TABS
650.0000 mg | ORAL_TABLET | ORAL | Status: DC | PRN
  Administered 2017-10-03 – 2017-10-05 (×3): 650 mg via ORAL
  Filled 2017-10-03 (×2): qty 2

## 2017-10-03 MED ORDER — BENZOCAINE-MENTHOL 20-0.5 % EX AERO
1.0000 "application " | INHALATION_SPRAY | CUTANEOUS | Status: DC | PRN
  Administered 2017-10-03: 1 via TOPICAL
  Filled 2017-10-03: qty 56

## 2017-10-03 MED ORDER — ONDANSETRON HCL 4 MG/2ML IJ SOLN: 4.0000 mg | INTRAMUSCULAR | Status: DC | PRN

## 2017-10-03 MED ORDER — LIDOCAINE HCL (PF) 1 % IJ SOLN
INTRAMUSCULAR | Status: DC | PRN
  Administered 2017-10-03: 13 mL via EPIDURAL

## 2017-10-03 MED ORDER — ONDANSETRON HCL 4 MG PO TABS: 4.0000 mg | ORAL_TABLET | ORAL | Status: DC | PRN

## 2017-10-03 MED ORDER — TRANEXAMIC ACID 1000 MG/10ML IV SOLN
INTRAVENOUS | Status: AC
  Administered 2017-10-03: 1000 mg via INTRAVENOUS
  Filled 2017-10-03: qty 10

## 2017-10-03 MED ORDER — SENNOSIDES-DOCUSATE SODIUM 8.6-50 MG PO TABS
2.0000 | ORAL_TABLET | Freq: Every evening | ORAL | Status: DC | PRN
  Administered 2017-10-04: 2 via ORAL
  Filled 2017-10-03: qty 2

## 2017-10-03 MED ORDER — LACTATED RINGERS AMNIOINFUSION
INTRAVENOUS | Status: DC
  Administered 2017-10-03: 19:00:00 via INTRAUTERINE
  Filled 2017-10-03 (×3): qty 1000

## 2017-10-03 MED ORDER — ZOLPIDEM TARTRATE 5 MG PO TABS: 5.0000 mg | ORAL_TABLET | Freq: Every evening | ORAL | Status: DC | PRN

## 2017-10-03 MED ORDER — DIPHENHYDRAMINE HCL 25 MG PO CAPS: 25.0000 mg | ORAL_CAPSULE | Freq: Four times a day (QID) | ORAL | Status: DC | PRN

## 2017-10-03 MED ORDER — MISOPROSTOL 50MCG HALF TABLET: 100.0000 ug | ORAL_TABLET | Freq: Once | ORAL | Status: DC

## 2017-10-03 MED ORDER — DIBUCAINE 1 % RE OINT: 1.0000 "application " | TOPICAL_OINTMENT | RECTAL | Status: DC | PRN

## 2017-10-03 MED ORDER — POLYETHYLENE GLYCOL 3350 17 G PO PACK
17.0000 g | PACK | Freq: Every day | ORAL | Status: DC
  Administered 2017-10-04 – 2017-10-05 (×2): 17 g via ORAL
  Filled 2017-10-03 (×3): qty 1

## 2017-10-03 MED ORDER — LIDOCAINE-EPINEPHRINE (PF) 2 %-1:200000 IJ SOLN
INTRAMUSCULAR | Status: DC | PRN
  Administered 2017-10-03 (×2): 5 mL via INTRADERMAL

## 2017-10-03 MED ORDER — MISOPROSTOL 200 MCG PO TABS
ORAL_TABLET | ORAL | Status: AC
  Filled 2017-10-03: qty 5

## 2017-10-03 MED ORDER — COCONUT OIL OIL
1.0000 "application " | TOPICAL_OIL | Status: DC | PRN
  Administered 2017-10-05: 1 via TOPICAL
  Filled 2017-10-03: qty 120

## 2017-10-03 MED ORDER — MISOPROSTOL 200 MCG PO TABS
1000.0000 ug | ORAL_TABLET | Freq: Once | ORAL | Status: AC
  Administered 2017-10-03: 1000 ug via RECTAL

## 2017-10-03 MED ORDER — WITCH HAZEL-GLYCERIN EX PADS: 1.0000 "application " | MEDICATED_PAD | CUTANEOUS | Status: DC | PRN

## 2017-10-03 NOTE — Progress Notes (Addendum)
OB/[facility]N Faculty [facility]: Labor Progress Note Tamara Lowery is a 22 y.o. G1P0 at [redacted]w[redacted]d by ultrasound admitted for induction of labor due to Gestational diabetes.  Subjective: Patient doing well with peanut ball. Reports she feels pressure with contractions like she needs to have a BM. Denies pain.   Objective: BP (!) 112/52   Pulse 65   Temp 98.1 F (36.7 C) (Oral)   Resp 16   Ht 5\' 7"  (1.702 m)   Wt 107.7 kg   LMP 12/26/2016 (Exact Date)   BMI 37.20 kg/m  Gen: Alert, resting on side  Dilation: 9 Effacement (%): 100 Cervical Position: Posterior Station: Plus 1 Presentation: Vertex Exam by:: Decrenzo  FHT: Baseline 140, moderate variability, occasion accelerations, variable decelerations.  Contractions q2-3 min   Assessment and Plan: 22 y.o. G1P0 [redacted]w[redacted]d admitted for induction of labor due to Gestational diabetes.  Labor: Progressing well  -- pain control: Epidural in place -- IUPC and scalp electrode in place    Fetal Well-Being: Cephalic by sutures .  -- Category 2- continuous fetal monitoring  -- GBS (Positive)   Charna Elizabeth MS4 [facility]  3:35 PM

## 2017-10-03 NOTE — Progress Notes (Signed)
Tamara Lowery is a 22 y.o. G1P0 at [redacted]w[redacted]d by ultrasound admitted for induction of labor due to Gestational diabetes.  Subjective:   Objective: BP 118/62   Pulse 70   Temp 98.1 F (36.7 C) (Oral)   Resp 16   Ht 5\' 7"  (1.702 m)   Wt 107.7 kg   LMP 12/26/2016 (Exact Date)   BMI 37.20 kg/m  No intake/output data recorded. No intake/output data recorded.  FHT:  FHR: 130's bpm, variability: moderate,  accelerations:  Present,  decelerations:  Absent UC:   regular, every 2 minutes SVE:   Dilation: 4 Effacement (%): 50 Station: -3 Exam by:: Zerita Boers  Labs: Lab Results  Component Value Date   WBC 12.7 (H) 10/02/2017   HGB 12.2 10/02/2017   HCT 36.6 10/02/2017   MCV 95.6 10/02/2017   PLT 243 10/02/2017    Assessment / Plan: Induction of labor due to gestational diabetes,  progressing well on pitocin  Labor: yet to be in active labor Preeclampsia:  no signs or symptoms of toxicity Fetal Wellbeing:  Category I Pain Control:  Labor support without medications I/D:  n/a Anticipated MOD:  NSVD  Wyvonnia Dusky 10/03/2017, 10:53 AM

## 2017-10-03 NOTE — Anesthesia Procedure Notes (Signed)
Epidural Patient location during procedure: OB Start time: 10/03/2017 12:29 PM End time: 10/03/2017 12:43 PM  Staffing Anesthesiologist: Lowella Curb, MD Performed: anesthesiologist   Preanesthetic Checklist Completed: patient identified, site marked, surgical consent, pre-op evaluation, timeout performed, IV checked, risks and benefits discussed and monitors and equipment checked  Epidural Patient position: sitting Prep: ChloraPrep Patient monitoring: heart rate, cardiac monitor, continuous pulse ox and blood pressure Approach: midline Location: L2-L3 Injection technique: LOR saline  Needle:  Needle type: Tuohy  Needle gauge: 17 G Needle length: 9 cm Needle insertion depth: 6 cm Catheter type: closed end flexible Catheter size: 20 Guage Catheter at skin depth: 10 cm Test dose: negative  Assessment Events: blood not aspirated, injection not painful, no injection resistance, negative IV test and no paresthesia  Additional Notes Reason for block:procedure for pain

## 2017-10-03 NOTE — Progress Notes (Signed)
Patient yelling "no" and flaying arms at staff. This RN refocused patient while MD inserting forceps. Pt pushed with contractions.

## 2017-10-03 NOTE — Anesthesia Preprocedure Evaluation (Signed)
Anesthesia Evaluation  Patient identified by MRN, date of birth, ID band Patient awake    Reviewed: Allergy & Precautions, NPO status , Patient's Chart, lab work & pertinent test results  Airway Mallampati: II  TM Distance: >3 FB Neck ROM: Full    Dental no notable dental hx.    Pulmonary neg pulmonary ROS, former smoker,    Pulmonary exam normal breath sounds clear to auscultation       Cardiovascular negative cardio ROS Normal cardiovascular exam Rhythm:Regular Rate:Normal     Neuro/Psych negative neurological ROS  negative psych ROS   GI/Hepatic negative GI ROS, Neg liver ROS,   Endo/Other  negative endocrine ROSdiabetes  Renal/GU negative Renal ROS  negative genitourinary   Musculoskeletal negative musculoskeletal ROS (+)   Abdominal   Peds negative pediatric ROS (+)  Hematology negative hematology ROS (+)   Anesthesia Other Findings   Reproductive/Obstetrics negative OB ROS (+) Pregnancy                             Anesthesia Physical Anesthesia Plan  ASA: II  Anesthesia Plan: Epidural   Post-op Pain Management:    Induction:   PONV Risk Score and Plan:   Airway Management Planned:   Additional Equipment:   Intra-op Plan:   Post-operative Plan:   Informed Consent:   Plan Discussed with:   Anesthesia Plan Comments:         Anesthesia Quick Evaluation

## 2017-10-03 NOTE — Consult Note (Addendum)
Neonatology Note:   Attendance at Delivery:    I was asked by Dr. Vergie Living to attend this vaginal delivery due to forcep assist at term. The mother is a G1, GBS + with aIAP with good prenatal care complicated by GDM diet controlled. ROM 9 hours before delivery, fluid clear. Shoulder dystocia.  Infant initially not vigorous nor with good spontaneous cry and tone. Cord immediately clamped and infant brought to warmer.  She responded quickly to warming and drying.  HR >100.  Needed only minimal bulb suctioning. Sao2 placed and high 90s.  Ap 8/9. Lungs clear to ausc in DR. Molding present. Clavicles and skin intact. Moving upper extremities equally.  Alert, active. +grasp and symm moro.  Parents updated. To CN to care of Pediatrician.  Dineen Kid Leary Roca, MD

## 2017-10-03 NOTE — Progress Notes (Signed)
Labor Progress Note Birgit Vannice is a 22 y.o. G1P0 at [redacted]w[redacted]d presented for induction of labor due toA1GDM. S: Doing well. CTX not showing up on external Toco, however pt feeling them ~q71min.  O:  BP (!) 117/53   Pulse 69   Temp 98.1 F (36.7 C) (Oral)   Resp 17   Ht 5\' 7"  (1.702 m)   Wt 107.7 kg   LMP 12/26/2016 (Exact Date)   BMI 37.20 kg/m  EFM: 135/mod var/+accels  CVE: Dilation: 5 Effacement (%): 50 Cervical Position: Posterior Station: -3 Presentation: Vertex Exam by:: Dr. Fara Boros   A&P: 22 y.o. G1P0 [redacted]w[redacted]d  for induction of labor due toA1GDM. #Labor: Progressing well. S/p cytotec x3. Foley bulb fell out around 21:40. On pit 12. Cervix very posterior, not comfortable w/ performing AROM at this time. Concerned that Toco is not picking up contractions since pt is feeling them. Will attempt AROM and IUPC at next check. Recommend uptitrating pit. #Pain: IV fentanyl #FWB: Cat1 FHT #GBS positive- on PCN  Denzil Hughes, MD 5:19 AM

## 2017-10-04 ENCOUNTER — Encounter: Payer: Self-pay | Admitting: *Deleted

## 2017-10-04 LAB — CBC
HEMATOCRIT: 30.7 % — AB (ref 36.0–46.0)
HEMOGLOBIN: 10.6 g/dL — AB (ref 12.0–15.0)
MCH: 32.8 pg (ref 26.0–34.0)
MCHC: 34.5 g/dL (ref 30.0–36.0)
MCV: 95 fL (ref 78.0–100.0)
Platelets: 205 10*3/uL (ref 150–400)
RBC: 3.23 MIL/uL — ABNORMAL LOW (ref 3.87–5.11)
RDW: 13.8 % (ref 11.5–15.5)
WBC: 16.8 10*3/uL — ABNORMAL HIGH (ref 4.0–10.5)

## 2017-10-04 NOTE — Lactation Note (Signed)
This note was copied from a baby's chart. Lactation Consultation Note  Patient Name: Tamara Lowery Today's Date: 10/04/2017 Reason for consult: Follow-up assessment;Term;Difficult latch Type of Endocrine Disorder?: Diabetes  P1 mother whose infant is now 1 hours old.  Mother called for assistance with hand pump  Mother stated that her hand pump was not working.  I assessed the pump to be set up correctly and demonstrated how to use it.  #24 flange size changed to a #27 flange size.  Mother was able to beginning pumping and drops of colostrum started slowly flowing.    Offered to assist with latching baby and mother accepted.  Assisted baby to latch in the football hold on the right breast without difficulty.  However, once at the breast, baby did not wish to suck.  She fell asleep.  Demonstrated gentle stimulation and awakening techniques and baby continued sleeping.  Mother wanted her swaddled so she could use her hand pump.    Her nipples are short shafted at rest but evert nicely with pre-pumping.  Mother knows how to hand express and has a colostrum container and spoon at bedside.  She will save any EBM she obtains for supplementation.  She will call at the next feed for latch assistance.     Maternal Data Formula Feeding for Exclusion: No Has patient been taught Hand Expression?: Yes Does the patient have breastfeeding experience prior to this delivery?: No  Feeding Feeding Type: Breast Fed Length of feed: 0 min  LATCH Score Latch: Too sleepy or reluctant, no latch achieved, no sucking elicited.  Audible Swallowing: None  Type of Nipple: Everted at rest and after stimulation  Comfort (Breast/Nipple): Soft / non-tender  Hold (Positioning): Assistance needed to correctly position infant at breast and maintain latch.  LATCH Score: 5  Interventions Interventions: Breast feeding basics reviewed;Assisted with latch;Skin to skin;Breast massage;Hand  express;Pre-pump if needed;Position options;Support pillows;Adjust position;Breast compression;Shells;Hand pump  Lactation Tools Discussed/Used     Consult Status Consult Status: Follow-up Date: 10/05/17 Follow-up type: In-patient    Garnett Nunziata R [place] Spees 10/04/2017, 10:26 AM

## 2017-10-04 NOTE — Progress Notes (Addendum)
Post Partum Day #1 Subjective: Pt is a 22 yo G1P1001 [redacted]w[redacted]d s/p IOL for G2Ma1. Underwent LFAVD 2/2 severe variable decelerations w/ contractions. Pt is doing well today w/ no complaints. No significant overnight events. Reports normal lochia w/ moderate vaginal bleeding that has been progressively decreasing. Tolerating PO intake, urinating normally, has not had a BM. Denies any nausea, vomiting, dizziness, abdominal pain, HA, or blurred vision. Plans to breastfeed; desired method of contraception unknown.   Objective: Blood pressure 119/67, pulse 67, temperature 98.3 F (36.8 C), temperature source Oral, resp. rate 16, height 5\' 7"  (1.702 m), weight 107.7 kg, last menstrual period 12/26/2016.  Physical Exam:  General: alert, cooperative and no distress Lochia: appropriate, decreasing Uterine Fundus: firm, below level of umbilicus  Incision: N/A DVT Evaluation: No evidence of DVT seen on physical exam.  Recent Labs    10/03/17 2024 10/04/17 0525  HGB 11.8* 10.6*  HCT 35.6* 30.7*    Assessment/Plan:  Pt is a 22 yo G1P1001 [redacted]w[redacted]d s/p LFAVD 2/2 FHR decelerations w/ EBL 700 mL. Hgb 10.6 this AM, pt asymptomatic.  Plan for discharge tomorrow; recheck CBC prior to discharge. F/u OP for BC.    LOS: 2 days   Tamara Lowery 10/04/2017, 9:29 AM   OB FELLOW MEDICAL STUDENT NOTE ATTESTATION  I confirm that I have verified the information documented in the PA student's note and that I have also personally performed the physical exam and all medical decision making activities.   Tamara Lowery is 22 y.o. G1P0 female PPD #1 from LFAVD with history of A1GDM. EBL at delivery 700 cc. HgB stable at 10.6 this AM, patient asymptomatic. Patient reports feeling well. Ambulating without difficulty, voiding normally, lochia is mild, pain is well controlled. . Vital signs stable. Physical exam benign with firm uterine fundus and no LE edema.  Patient is breast feeding. Plans for undecided for  contraception.  Plan for d/c tomorrow AM.   Marcy Siren, D.O. OB Fellow  10/04/2017, 9:57 AM

## 2017-10-04 NOTE — Anesthesia Postprocedure Evaluation (Signed)
Anesthesia Post Note  Patient: Tamara Lowery  Procedure(s) Performed: AN AD HOC LABOR EPIDURAL     Patient location during evaluation: Mother Baby Anesthesia Type: Epidural Level of consciousness: awake Pain management: satisfactory to patient Vital Signs Assessment: post-procedure vital signs reviewed and stable Respiratory status: spontaneous breathing Cardiovascular status: stable Anesthetic complications: no    Last Vitals:  Vitals:   10/04/17 0256 10/04/17 0633  BP: 122/74 119/67  Pulse: 73 67  Resp: 17 16  Temp:  36.8 C    Last Pain:  Vitals:   10/04/17 0633  TempSrc: Oral  PainSc: 2    Pain Goal:                 Cephus Shelling

## 2017-10-04 NOTE — Lactation Note (Signed)
This note was copied from a baby's chart. Lactation Consultation Note Baby 10 hrs old. Mom states baby hasn't fed since birth. Mom has semi flat nipples at rest that everts well w/stimulation. Breast massage colostrum. Baby quiet while hand expressing. Attempted suck training, has no interest in suck training.  Baby jittery. Notified RN. Temp slightly low. Placed STS on mom's chest. Hand expression taught collected 2 ml, gave w/spoon, cheeks needed to be massaged to swallow, Encouraged mom to hand express, STS, strict I&O.  Will give shells and hand pump to pre-pump prior to latching.  Newborn feeding habits discussed, as well as milk production and stimulation. Mom encouraged to feed baby 8-12 times/24 hours and with feeding cues. Mom encouraged to feed baby 8-12 times/24 hours and with feeding cues.  [facility] brochure given w/resources, support groups and LC services.  Patient Name: Tamara Lowery Today's Date: 10/04/2017 Reason for consult: Initial assessment;Maternal endocrine disorder Type of Endocrine Disorder?: Diabetes   Maternal Data Has patient been taught Hand Expression?: Yes Does the patient have breastfeeding experience prior to this delivery?: No  Feeding Feeding Type: Breast Milk  LATCH Score Latch: Too sleepy or reluctant, no latch achieved, no sucking elicited.  Audible Swallowing: None  Type of Nipple: Flat(semi flat at rest/everts w/stimulation)  Comfort (Breast/Nipple): Soft / non-tender        Interventions Interventions: Breast feeding basics reviewed;Support pillows;Assisted with latch;Position options;Skin to skin;Expressed milk;Breast massage;Hand express;Shells;Pre-pump if needed;Hand pump;Breast compression;Adjust position  Lactation Tools Discussed/Used WIC Program: Yes   Consult Status Consult Status: Follow-up Date: 10/04/17 Follow-up type: In-patient    Clayborn Milnes, Diamond Nickel 10/04/2017, 5:07 AM

## 2017-10-05 MED ORDER — IBUPROFEN 600 MG PO TABS: 600.0000 mg | ORAL_TABLET | Freq: Four times a day (QID) | ORAL | 0 refills | Status: DC | PRN

## 2017-10-05 NOTE — Discharge Instructions (Signed)
Vaginal Delivery, Care After °Refer to this sheet in the next few weeks. These instructions provide you with information about caring for yourself after vaginal delivery. Your health care provider may also give you more specific instructions. Your treatment has been planned according to current medical practices, but problems sometimes occur. Call your health care provider if you have any problems or questions. °What can I expect after the procedure? °After vaginal delivery, it is common to have: °· Some bleeding from your vagina. °· Soreness in your abdomen, your vagina, and the area of skin between your vaginal opening and your anus (perineum). °· Pelvic cramps. °· Fatigue. ° °Follow these instructions at home: °Medicines °· Take over-the-counter and prescription medicines only as told by your health care provider. °· If you were prescribed an antibiotic medicine, take it as told by your health care provider. Do not stop taking the antibiotic until it is finished. °Driving ° °· Do not drive or operate heavy machinery while taking prescription pain medicine. °· Do not drive for 24 hours if you received a sedative. °Lifestyle °· Do not drink alcohol. This is especially important if you are breastfeeding or taking medicine to relieve pain. °· Do not use tobacco products, including cigarettes, chewing tobacco, or e-cigarettes. If you need help quitting, ask your health care provider. °Eating and drinking °· Drink at least 8 eight-ounce glasses of water every day unless you are told not to by your health care provider. If you choose to breastfeed your baby, you may need to drink more water than this. °· Eat high-fiber foods every day. These foods may help prevent or relieve constipation. High-fiber foods include: °? Whole grain cereals and breads. °? Brown rice. °? Beans. °? Fresh fruits and vegetables. °Activity °· Return to your normal activities as told by your health care provider. Ask your health care provider  what activities are safe for you. °· Rest as much as possible. Try to rest or take a nap when your baby is sleeping. °· Do not lift anything that is heavier than your baby or 10 lb (4.5 kg) until your health care provider says that it is safe. °· Talk with your health care provider about when you can engage in sexual activity. This may depend on your: °? Risk of infection. °? Rate of healing. °? Comfort and desire to engage in sexual activity. °Vaginal Care °· If you have an episiotomy or a vaginal tear, check the area every day for signs of infection. Check for: °? More redness, swelling, or pain. °? More fluid or blood. °? Warmth. °? Pus or a bad smell. °· Do not use tampons or douches until your health care provider says this is safe. °· Watch for any blood clots that may pass from your vagina. These may look like clumps of dark red, brown, or black discharge. °General instructions °· Keep your perineum clean and dry as told by your health care provider. °· Wear loose, comfortable clothing. °· Wipe from front to back when you use the toilet. °· Ask your health care provider if you can shower or take a bath. If you had an episiotomy or a perineal tear during labor and delivery, your health care provider may tell you not to take baths for a certain length of time. °· Wear a bra that supports your breasts and fits you well. °· If possible, have someone help you with household activities and help care for your baby for at least a few days after   you leave the hospital. °· Keep all follow-up visits for you and your baby as told by your health care provider. This is important. °Contact a health care provider if: °· You have: °? Vaginal discharge that has a bad smell. °? Difficulty urinating. °? Pain when urinating. °? A sudden increase or decrease in the frequency of your bowel movements. °? More redness, swelling, or pain around your episiotomy or vaginal tear. °? More fluid or blood coming from your episiotomy or  vaginal tear. °? Pus or a bad smell coming from your episiotomy or vaginal tear. °? A fever. °? A rash. °? Little or no interest in activities you used to enjoy. °? Questions about caring for yourself or your baby. °· Your episiotomy or vaginal tear feels warm to the touch. °· Your episiotomy or vaginal tear is separating or does not appear to be healing. °· Your breasts are painful, hard, or turn red. °· You feel unusually sad or worried. °· You feel nauseous or you vomit. °· You pass large blood clots from your vagina. If you pass a blood clot from your vagina, save it to show to your health care provider. Do not flush blood clots down the toilet without having your health care provider look at them. °· You urinate more than usual. °· You are dizzy or light-headed. °· You have not breastfed at all and you have not had a menstrual period for 12 weeks after delivery. °· You have stopped breastfeeding and you have not had a menstrual period for 12 weeks after you stopped breastfeeding. °Get help right away if: °· You have: °? Pain that does not go away or does not get better with medicine. °? Chest pain. °? Difficulty breathing. °? Blurred vision or spots in your vision. °? Thoughts about hurting yourself or your baby. °· You develop pain in your abdomen or in one of your legs. °· You develop a severe headache. °· You faint. °· You bleed from your vagina so much that you fill two sanitary pads in one hour. °This information is not intended to replace advice given to you by your health care provider. Make sure you discuss any questions you have with your health care provider. °Document Released: 01/10/2000 Document Revised: 06/26/2015 Document Reviewed: 01/27/2015 °Elsevier Interactive Patient Education © 2018 [employer] ° °

## 2017-10-05 NOTE — Plan of Care (Signed)
Patient is having perineum pain; it is being managed with the use of Oxycodone, Acetaminophen, and Ibuprofen interchangeably.  RN will give patient a sitz bath and discuss how to use.

## 2017-10-05 NOTE — Lactation Note (Signed)
This note was copied from a baby's chart. Lactation Consultation Note  Patient Name: Tamara Lowery Today's Date: 10/05/2017 Reason for consult: Follow-up assessment   P1, Baby 38 hours old.  Mother has abrasion on tip of R nipple. Provided mother w/coconut oil for soreness.  Also encouraged using ebm. Had mother prepump w/ manual pump and hand express prior to latching. Mother has good flow of colostrum. Assisted w/ latching baby in cross cradle hold w/ increased depth. Mom encouraged to feed baby 8-12 times/24 hours and with feeding cues.  Reviewed engorgement care and monitoring voids/stools.    Maternal Data Has patient been taught Hand Expression?: Yes  Feeding Feeding Type: Breast Fed  LATCH Score Latch: Grasps breast easily, tongue down, lips flanged, rhythmical sucking.  Audible Swallowing: A few with stimulation  Type of Nipple: Everted at rest and after stimulation  Comfort (Breast/Nipple): Filling, red/small blisters or bruises, mild/mod discomfort  Hold (Positioning): Assistance needed to correctly position infant at breast and maintain latch.  LATCH Score: 7  Interventions Interventions: Assisted with latch;Skin to skin;Breast massage;Hand express;Breast compression;Pre-pump if needed;Adjust position;Support pillows;Expressed milk;Coconut oil;Hand pump  Lactation Tools Discussed/Used     Consult Status Consult Status: Complete Date: 10/05/17    Tamara Lowery [facility] 10/05/2017, 9:15 AM

## 2017-10-05 NOTE — Plan of Care (Signed)
Patient is still complaining of Right Hip pain/soreness.  RN encouraged patient to call MD if soreness does not improve over the next couple of days or if it worsens at all.  Instructed patient that she can take Tylenol in between her doses of Ibuprofen but to not exceed 4000 mg in 24 hours.

## 2017-10-05 NOTE — Discharge Summary (Signed)
OB Discharge Summary     Patient Name: Tamara Lowery DOB: [birth year] MRN: [id number]  Date of admission: 10/02/2017 Delivering MD: Anamosa Bing   Date of discharge: 10/05/2017  Admitting diagnosis: INDUCTION Intrauterine pregnancy: [redacted]w[redacted]d     Secondary diagnosis:  Active Problems:   Supervision of high risk pregnancy, antepartum   Excessive weight gain   GDM (gestational diabetes mellitus), class A1   Group B Streptococcus carrier, +RV culture, currently pregnant   Labor and delivery indication for care or intervention  Additional problems: none     Discharge diagnosis: Term Pregnancy Delivered and GDM A1                                                                                                Post partum procedures:none  Augmentation: AROM, Pitocin, Cytotec and Foley Balloon  Complications: None  Hospital course:  Induction of Labor With Vaginal Delivery   22 y.o. yo G1P0 at [redacted]w[redacted]d was admitted to the hospital 10/02/2017 for induction of labor.  Indication for induction: A1 DM.  Patient had a labor course remarkable for becoming complete using the usual IOL methods, and then experiencing severe fetal decelerations. She was delivered with low forceps- see delivery note by Dr Vergie Living. Infant eval by peds team; stayed in room with mother. Membrane Rupture Time/Date: 10:30 AM ,10/03/2017   Intrapartum Procedures: Episiotomy: None [1]                                         Lacerations:  2nd degree [3];Perineal [11]  Patient had delivery of a Viable infant.  Information for the patient's newborn:  Daela, Stallard Girl Kao [[id number]]  Delivery Method: Vag-Forceps   10/03/2017  Details of delivery can be found in separate delivery note.  Patient had a routine postpartum course. Patient is discharged home 10/05/17.  Physical exam  Vitals:   10/04/17 0256 10/04/17 0633 10/04/17 2224 10/05/17 0507  BP: 122/74 119/67 128/73 108/65  Pulse: 73 67 75 72  Resp: 17  16 16 18   Temp:  98.3 F (36.8 C) 98.3 F (36.8 C) 98.2 F (36.8 C)  TempSrc:  Oral Oral Oral  Weight:      Height:       General: alert and cooperative Lochia: appropriate Uterine Fundus: firm Incision: N/A DVT Evaluation: No evidence of DVT seen on physical exam. Labs: Lab Results  Component Value Date   WBC 16.8 (H) 10/04/2017   HGB 10.6 (L) 10/04/2017   HCT 30.7 (L) 10/04/2017   MCV 95.0 10/04/2017   PLT 205 10/04/2017   CMP Latest Ref Rng & Units 08/07/2017  Glucose 70 - 99 mg/dL 89  BUN 6 - 20 mg/dL 5(L)  Creatinine 5.63 - 1.00 mg/dL 8.93(T)  Sodium 342 - 876 mmol/L 131(L)  Potassium 3.5 - 5.1 mmol/L 3.2(L)  Chloride 98 - 111 mmol/L 102  CO2 22 - 32 mmol/L 18(L)  Calcium 8.9 - 10.3 mg/dL 8.1(L)    Discharge instruction: per After Visit Summary and "Baby  and Me Booklet".  After visit meds:  Allergies as of 10/05/2017   No Known Allergies     Medication List    STOP taking these medications   ACCU-CHEK FASTCLIX LANCETS Misc   calcium carbonate 500 MG chewable tablet Commonly known as:  TUMS - dosed in mg elemental calcium   glucose blood test strip   ondansetron 4 MG disintegrating tablet Commonly known as:  ZOFRAN-ODT     TAKE these medications   ibuprofen 600 MG tablet Commonly known as:  ADVIL,MOTRIN Take 1 tablet (600 mg total) by mouth every 6 (six) hours as needed.   Prenatal Vitamins 0.8 MG tablet Take 1 tablet by mouth daily.       Diet: routine diet  Activity: Advance as tolerated. Pelvic rest for 6 weeks.   Outpatient follow up:6 weeks- to include a GTT at visit Follow up Appt: Future Appointments  Date Time Provider Department Center  11/15/2017  8:20 AM [facility]  11/15/2017  8:55 AM Armando Reichert, CNM [facility]   Follow up Visit:No follow-ups on file.  Postpartum contraception: Undecided  Newborn Data: Live born female  Birth Weight: 6 lb 9.5 oz (2991 g) APGAR: 8, 9  Newborn Delivery   Birth  date/time:  10/03/2017 19:00:00 Delivery type:  Vaginal, Forceps     Baby Feeding: Breast Disposition:home with mother   10/05/2017 Cam Hai, CNM  11:47 AM

## 2017-10-07 NOTE — H&P (Addendum)
Tamara Lowery is a 22 y.o. female G1P0 @ [redacted]w[redacted]d presenting for IOL for A1GDM.   Clinic  [facility] Prenatal Labs  Dating  LMP Blood type:   A pos  Genetic Screen 1 Screen: normal   AFP:     Quad:     NIPS: Antibody:  neg  Anatomic [place]  normal Rubella:  immune  GTT Early:               Third trimester: 93/106/127 RPR:   NR  Flu vaccine  declines HBsAg:   neg  TDaP vaccine  07/14/17                       Rhogam: NA HIV:  NR  Baby Food  breastfeed                                             GBS: positive  Contraception  unsure Pap: NA, age 77 at NOB  Circumcision  n/a girl   Pediatrician  given list CF: neg  Support Person  Erasmo Leventhal (mom) SMA neg  Prenatal Classes  Hgb electrophoresis: low HgbA2-iron anemia vs. Alpha thalassemia? Discuss at 03-26-2017 visit    OB History    Gravida  1   Para      Term      Preterm      AB      Living        SAB      TAB      Ectopic      Multiple      Live Births             Past Medical History:  Diagnosis Date  . Diabetes mellitus without complication (HCC)   . Gestational diabetes   . Trichomonal infection    Past Surgical History:  Procedure Laterality Date  . NO PAST SURGERIES     Family History: family history is not on file. Social History:  reports that she has quit smoking. Her smoking use included cigarettes. She smoked 0.02 packs per day. She has never used smokeless tobacco. She reports that she has current or past drug history. Drug: Marijuana. She reports that she does not drink alcohol.     Maternal Diabetes: Yes:  Diabetes Type:  Diet controlled Genetic Screening: Normal Maternal Ultrasounds/Referrals: Normal Fetal Ultrasounds or other Referrals:  None Maternal Substance Abuse:  No Significant Maternal Medications:  None Significant Maternal Lab Results:  Lab values include: Group B Strep positive Other Comments:  None  Review of Systems  Constitutional: Negative for chills, fever and  malaise/fatigue.  Eyes: Negative for blurred vision.  Respiratory: Negative for cough and shortness of breath.   Cardiovascular: Negative for chest pain.  Gastrointestinal: Negative for heartburn and vomiting.  Genitourinary: Negative for dysuria, frequency and urgency.  Musculoskeletal: Negative.   Neurological: Negative for dizziness and headaches.  Psychiatric/Behavioral: Negative for depression.   Maternal Medical History:  Reason for admission: IOL for GDM  Contractions: Frequency: irregular.   Perceived severity is mild.    Fetal activity: Perceived fetal activity is normal.   Last perceived fetal movement was within the past hour.    Prenatal complications: no prenatal complications Prenatal Complications - Diabetes: gestational.    Dilation: 10 Effacement (%): 100 Station: Plus 1 Exam by:: Zerita Boers Blood pressure 108/65, pulse 72, temperature  98.2 F (36.8 C), temperature source Oral, resp. rate 18, height 5\' 7"  (1.702 m), weight 107.7 kg, last menstrual period 12/26/2016. Maternal Exam:  Uterine Assessment: Contraction strength is mild.  Contraction frequency is irregular.   Abdomen: Fetal presentation: vertex  Cervix: Cervix evaluated by digital exam.     Fetal Exam Fetal Monitor Review: Mode: ultrasound.   Baseline rate: 135.  Variability: moderate (6-25 bpm).   Pattern: accelerations present and no decelerations.    Fetal State Assessment: Category I - tracings are normal.     Physical Exam  Nursing note and vitals reviewed. Constitutional: She is oriented to person, place, and time. She appears well-developed and well-nourished.  Neck: Normal range of motion.  Cardiovascular: Normal rate, regular rhythm and normal heart sounds.  Respiratory: Effort normal.  GI: Soft.  Musculoskeletal: Normal range of motion.  Neurological: She is alert and oriented to person, place, and time.  Skin: Skin is warm and dry.  Psychiatric: She has a normal mood  and affect. Her behavior is normal. Judgment and thought content normal.    Prenatal labs: ABO, Rh: --/--/A POS, A POS Performed at [facility], [address] [place], [place] [address]  [phone](09/07 0815) Antibody: NEG (09/07 0815) Rubella: 2.59 (02/15 1444) RPR: Non Reactive (09/07 0819)  HBsAg: Negative (02/15 1444)  HIV: Non Reactive (06/19 0834)  GBS: Positive (08/12 0919)   Assessment/Plan: 22 y.o. @[redacted]w[redacted]d  by LMP with IOL for A1 GDM Plan to place Foley Bulb and/or use Cytotec for cervical ripening GBS positive, PCN for GBS prophylaxis Anticipate NSVD  Sharen CounterLisa Leftwich-Kirby 10/07/2017, 8:35 AM

## 2017-10-20 ENCOUNTER — Ambulatory Visit: Payer: Self-pay | Admitting: Obstetrics & Gynecology

## 2017-11-12 ENCOUNTER — Other Ambulatory Visit: Payer: Self-pay | Admitting: *Deleted

## 2017-11-12 DIAGNOSIS — O24439 Gestational diabetes mellitus in the puerperium, unspecified control: Secondary | ICD-10-CM

## 2017-11-15 ENCOUNTER — Other Ambulatory Visit: Payer: Self-pay

## 2017-11-15 ENCOUNTER — Ambulatory Visit: Payer: Self-pay | Admitting: Advanced Practice Midwife

## 2017-11-19 ENCOUNTER — Other Ambulatory Visit: Payer: Self-pay

## 2017-11-19 ENCOUNTER — Ambulatory Visit: Payer: Self-pay | Admitting: Obstetrics & Gynecology

## 2017-11-30 ENCOUNTER — Ambulatory Visit (HOSPITAL_COMMUNITY)
Admission: EM | Admit: 2017-11-30 | Discharge: 2017-11-30 | Disposition: A | Discharge status: Home / Self Care | Payer: Medicaid Other | Attending: Family Medicine | Admitting: Family Medicine

## 2017-11-30 ENCOUNTER — Encounter (HOSPITAL_COMMUNITY): Payer: Self-pay | Admitting: Emergency Medicine

## 2017-11-30 DIAGNOSIS — K047 Periapical abscess without sinus: Secondary | ICD-10-CM

## 2017-11-30 MED ORDER — NAPROXEN 500 MG PO TABS: 500.0000 mg | ORAL_TABLET | Freq: Two times a day (BID) | ORAL | 0 refills | Status: DC

## 2017-11-30 MED ORDER — TRAMADOL HCL 50 MG PO TABS: 50.0000 mg | ORAL_TABLET | Freq: Four times a day (QID) | ORAL | 0 refills | Status: DC | PRN

## 2017-11-30 MED ORDER — PENICILLIN V POTASSIUM 500 MG PO TABS: 500.0000 mg | ORAL_TABLET | Freq: Four times a day (QID) | ORAL | 0 refills | Status: AC

## 2017-11-30 NOTE — Discharge Instructions (Addendum)
We will go ahead and treat you for dental infection with penicillin x10 days Naproxen twice daily with food for mild to moderate pain inflammation Tramadol for more severe pain You need to follow-up with the dentist for further management

## 2017-11-30 NOTE — ED Provider Notes (Signed)
[facility]    CSN: [id number] Arrival date & time: 11/30/17  1022     History   Chief Complaint Chief Complaint  Patient presents with  . Dental Pain    HPI Tamara Lowery is a 22 y.o. female.    Dental Pain  Location:  Lower Quality:  Aching and throbbing Severity:  Moderate Onset quality:  Gradual Duration:  3 days Progression:  Worsening Chronicity:  New Context: abscess, dental caries and dental fracture   Relieved by:  Nothing Worsened by:  Jaw movement, pressure and touching Ineffective treatments:  NSAIDs Associated symptoms: no congestion, no difficulty swallowing, no drooling, no facial pain, no facial swelling, no fever, no gum swelling, no headaches, no neck pain, no neck swelling, no oral bleeding, no oral lesions and no trismus   Risk factors: lack of dental care     Past Medical History:  Diagnosis Date  . Diabetes mellitus without complication (HCC)   . Gestational diabetes   . Trichomonal infection     Patient Active Problem List   Diagnosis Date Noted  . Labor and delivery indication for care or intervention 10/02/2017  . Group B Streptococcus carrier, +RV culture, currently pregnant 09/08/2017  . GDM (gestational diabetes mellitus), class A1 08/19/2017  . Excessive weight gain 07/14/2017  . Low blood hemoglobin A2 03/24/2017  . Supervision of high risk pregnancy, antepartum 03/12/2017    Past Surgical History:  Procedure Laterality Date  . NO PAST SURGERIES      OB History    Gravida  1   Para      Term      Preterm      AB      Living        SAB      TAB      Ectopic      Multiple      Live Births               Home Medications    Prior to Admission medications   Medication Sig Start Date End Date Taking? Authorizing Provider  ibuprofen (ADVIL,MOTRIN) 600 MG tablet Take 1 tablet (600 mg total) by mouth every 6 (six) hours as needed. 10/05/17   Arabella Merles, CNM  naproxen  (NAPROSYN) 500 MG tablet Take 1 tablet (500 mg total) by mouth 2 (two) times daily. 11/30/17   Dahlia Byes A, NP  penicillin v potassium (VEETID) 500 MG tablet Take 1 tablet (500 mg total) by mouth 4 (four) times daily for 10 days. 11/30/17 12/10/17  Dahlia Byes A, NP  Prenatal Multivit-Min-Fe-FA (PRENATAL VITAMINS) 0.8 MG tablet Take 1 tablet by mouth daily. 03/12/17   Marylene Land, CNM  traMADol (ULTRAM) 50 MG tablet Take 1 tablet (50 mg total) by mouth every 6 (six) hours as needed. 11/30/17   Janace Aris, NP    Family History History reviewed. No pertinent family history.  Social History Social History   Tobacco Use  . Smoking status: Former Smoker    Packs/day: 0.02    Types: Cigarettes  . Smokeless tobacco: Never Used  Substance Use Topics  . Alcohol use: No  . Drug use: Yes    Types: Marijuana    Comment: stopped when found out she was pregnant     Allergies   Patient has no known allergies.   Review of Systems Review of Systems  Constitutional: Negative for fever.  HENT: Negative for congestion, drooling, facial swelling and mouth sores.  Musculoskeletal: Negative for neck pain.  Neurological: Negative for headaches.     Physical Exam Triage Vital Signs ED Triage Vitals [11/30/17 1126]  Enc Vitals Group     BP 127/86     Pulse Rate 63     Resp 18     Temp 98.6 F (37 C)     Temp Source Oral     SpO2 99 %     Weight      Height      Head Circumference      Peak Flow      Pain Score 10     Pain Loc      Pain Edu?      Excl. in GC?    No data found.  Updated Vital Signs BP 127/86 (BP Location: Right Arm)   Pulse 63   Temp 98.6 F (37 C) (Oral)   Resp 18   LMP 12/26/2016 (LMP Unknown)   SpO2 99%   Breastfeeding? Unknown   Visual Acuity Right Eye Distance:   Left Eye Distance:   Bilateral Distance:    Right Eye Near:   Left Eye Near:    Bilateral Near:     Physical Exam  Constitutional: She appears well-developed and  well-nourished.  Appears in pain   HENT:  Head: Normocephalic and atraumatic.  Mouth/Throat: No oral lesions. No trismus in the jaw. Dental abscesses and dental caries present. No uvula swelling.    Eyes: Conjunctivae are normal.  Neck: Normal range of motion.  Pulmonary/Chest: Effort normal.  Musculoskeletal: Normal range of motion.  Lymphadenopathy:    She has no cervical adenopathy.  Neurological: She is alert.  Skin: Skin is warm and dry.  Psychiatric: She has a normal mood and affect.  Nursing note and vitals reviewed.    UC Treatments / Results  Labs (all labs ordered are listed, but only abnormal results are displayed) Labs Reviewed - No data to display  EKG None  Radiology No results found.  Procedures Procedures (including critical care time)  Medications Ordered in UC Medications - No data to display  Initial Impression / Assessment and Plan / UC Course  I have reviewed the triage vital signs and the nursing notes.  Pertinent labs & imaging results that were available during my care of the patient were reviewed by me and considered in my medical decision making (see chart for details).     Will go ahead and treat for dental infection with penicillin Naproxen for pain and inflammation Tramadol for worsening pain follow up with dentist for further management.  Final Clinical Impressions(s) / UC Diagnoses   Final diagnoses:  Dental infection     Discharge Instructions     We will go ahead and treat you for dental infection with penicillin x10 days Naproxen twice daily with food for mild to moderate pain inflammation Tramadol for more severe pain You need to follow-up with the dentist for further management    ED Prescriptions    Medication Sig Dispense Auth. Provider   penicillin v potassium (VEETID) 500 MG tablet Take 1 tablet (500 mg total) by mouth 4 (four) times daily for 10 days. 40 tablet Larisa Lanius A, NP   naproxen (NAPROSYN) 500 MG  tablet Take 1 tablet (500 mg total) by mouth 2 (two) times daily. 30 tablet Marykathleen Russi A, NP   traMADol (ULTRAM) 50 MG tablet Take 1 tablet (50 mg total) by mouth every 6 (six) hours as needed. 10 tablet [place], [place]  A, NP     Controlled Substance Prescriptions [place] Controlled Substance Registry consulted? Not Applicable   Janace Aris, NP 11/30/17 1238

## 2017-11-30 NOTE — ED Triage Notes (Signed)
Pt here for left sided dental pain with broken tooth

## 2018-06-29 ENCOUNTER — Emergency Department (HOSPITAL_COMMUNITY)
Admission: EM | Admit: 2018-06-29 | Discharge: 2018-06-29 | Disposition: A | Discharge status: Home / Self Care | Payer: Medicaid Other | Attending: Emergency Medicine | Admitting: Emergency Medicine

## 2018-06-29 ENCOUNTER — Encounter (HOSPITAL_COMMUNITY): Payer: Self-pay | Admitting: Emergency Medicine

## 2018-06-29 DIAGNOSIS — E119 Type 2 diabetes mellitus without complications: Secondary | ICD-10-CM | POA: Diagnosis not present

## 2018-06-29 DIAGNOSIS — F1210 Cannabis abuse, uncomplicated: Secondary | ICD-10-CM | POA: Diagnosis not present

## 2018-06-29 DIAGNOSIS — Z79899 Other long term (current) drug therapy: Secondary | ICD-10-CM | POA: Insufficient documentation

## 2018-06-29 DIAGNOSIS — R112 Nausea with vomiting, unspecified: Secondary | ICD-10-CM | POA: Insufficient documentation

## 2018-06-29 DIAGNOSIS — R197 Diarrhea, unspecified: Secondary | ICD-10-CM | POA: Insufficient documentation

## 2018-06-29 DIAGNOSIS — Z87891 Personal history of nicotine dependence: Secondary | ICD-10-CM | POA: Insufficient documentation

## 2018-06-29 DIAGNOSIS — E876 Hypokalemia: Secondary | ICD-10-CM | POA: Diagnosis not present

## 2018-06-29 LAB — CBC WITH DIFFERENTIAL/PLATELET
Abs Immature Granulocytes: 0.07 10*3/uL (ref 0.00–0.07)
Basophils Absolute: 0.1 10*3/uL (ref 0.0–0.1)
Basophils Relative: 0 %
Eosinophils Absolute: 0.1 10*3/uL (ref 0.0–0.5)
Eosinophils Relative: 1 %
HCT: 49.7 % — ABNORMAL HIGH (ref 36.0–46.0)
Hemoglobin: 16.1 g/dL — ABNORMAL HIGH (ref 12.0–15.0)
Immature Granulocytes: 0 %
Lymphocytes Relative: 5 %
Lymphs Abs: 1.1 10*3/uL (ref 0.7–4.0)
MCH: 30.7 pg (ref 26.0–34.0)
MCHC: 32.4 g/dL (ref 30.0–36.0)
MCV: 94.8 fL (ref 80.0–100.0)
Monocytes Absolute: 1.3 10*3/uL — ABNORMAL HIGH (ref 0.1–1.0)
Monocytes Relative: 6 %
Neutro Abs: 17.1 10*3/uL — ABNORMAL HIGH (ref 1.7–7.7)
Neutrophils Relative %: 88 %
Platelets: 337 10*3/uL (ref 150–400)
RBC: 5.24 MIL/uL — ABNORMAL HIGH (ref 3.87–5.11)
RDW: 13.2 % (ref 11.5–15.5)
WBC: 19.7 10*3/uL — ABNORMAL HIGH (ref 4.0–10.5)
nRBC: 0 % (ref 0.0–0.2)

## 2018-06-29 LAB — URINALYSIS, ROUTINE W REFLEX MICROSCOPIC
Bilirubin Urine: NEGATIVE
Glucose, UA: NEGATIVE mg/dL
Ketones, ur: NEGATIVE mg/dL
Leukocytes,Ua: NEGATIVE
Nitrite: NEGATIVE
Protein, ur: NEGATIVE mg/dL
Specific Gravity, Urine: 1.028 (ref 1.005–1.030)
pH: 5 (ref 5.0–8.0)

## 2018-06-29 LAB — COMPREHENSIVE METABOLIC PANEL
ALT: 36 U/L (ref 0–44)
AST: 34 U/L (ref 15–41)
Albumin: 5.1 g/dL — ABNORMAL HIGH (ref 3.5–5.0)
Alkaline Phosphatase: 112 U/L (ref 38–126)
Anion gap: 11 (ref 5–15)
BUN: 17 mg/dL (ref 6–20)
CO2: 20 mmol/L — ABNORMAL LOW (ref 22–32)
Calcium: 9.3 mg/dL (ref 8.9–10.3)
Chloride: 106 mmol/L (ref 98–111)
Creatinine, Ser: 0.62 mg/dL (ref 0.44–1.00)
GFR calc Af Amer: >60 mL/min (ref >60)
GFR calc non Af Amer: >60 mL/min (ref >60)
Glucose, Bld: 139 mg/dL — ABNORMAL HIGH (ref 70–99)
Potassium: 3.3 mmol/L — ABNORMAL LOW (ref 3.5–5.1)
Sodium: 137 mmol/L (ref 135–145)
Total Bilirubin: 0.3 mg/dL (ref 0.3–1.2)
Total Protein: 8.5 g/dL — ABNORMAL HIGH (ref 6.5–8.1)

## 2018-06-29 LAB — RAPID URINE DRUG SCREEN, HOSP PERFORMED
Amphetamines: POSITIVE — AB
Barbiturates: NOT DETECTED
Benzodiazepines: NOT DETECTED
Cocaine: NOT DETECTED
Opiates: NOT DETECTED
Tetrahydrocannabinol: POSITIVE — AB

## 2018-06-29 LAB — I-STAT BETA HCG BLOOD, ED (MC, WL, AP ONLY): I-stat hCG, quantitative: <5 m[IU]/mL (ref <5)

## 2018-06-29 LAB — ETHANOL: Alcohol, Ethyl (B): <10 mg/dL (ref <10)

## 2018-06-29 LAB — LIPASE, BLOOD: Lipase: 36 U/L (ref 11–51)

## 2018-06-29 MED ORDER — FAMOTIDINE 20 MG PO TABS: 20.0000 mg | ORAL_TABLET | Freq: Two times a day (BID) | ORAL | 0 refills | Status: DC

## 2018-06-29 MED ORDER — ONDANSETRON HCL 4 MG/2ML IJ SOLN
4.0000 mg | Freq: Once | INTRAMUSCULAR | Status: AC
  Administered 2018-06-29: 03:00:00 4 mg via INTRAVENOUS
  Filled 2018-06-29: qty 2

## 2018-06-29 MED ORDER — SODIUM CHLORIDE 0.9 % IV BOLUS
1000.0000 mL | Freq: Once | INTRAVENOUS | Status: AC
  Administered 2018-06-29: 05:00:00 1000 mL via INTRAVENOUS

## 2018-06-29 MED ORDER — PROMETHAZINE HCL 25 MG/ML IJ SOLN
25.0000 mg | Freq: Once | INTRAMUSCULAR | Status: AC
  Administered 2018-06-29: 25 mg via INTRAVENOUS
  Filled 2018-06-29: qty 1

## 2018-06-29 MED ORDER — POTASSIUM CHLORIDE CRYS ER 20 MEQ PO TBCR: 20.0000 meq | EXTENDED_RELEASE_TABLET | Freq: Every day | ORAL | 0 refills | Status: DC

## 2018-06-29 MED ORDER — LOPERAMIDE HCL 2 MG PO CAPS
4.0000 mg | ORAL_CAPSULE | Freq: Once | ORAL | Status: AC
  Administered 2018-06-29: 4 mg via ORAL
  Filled 2018-06-29: qty 2

## 2018-06-29 MED ORDER — SODIUM CHLORIDE 0.9 % IV BOLUS
1000.0000 mL | Freq: Once | INTRAVENOUS | Status: AC
  Administered 2018-06-29: 03:00:00 1000 mL via INTRAVENOUS

## 2018-06-29 MED ORDER — LOPERAMIDE HCL 2 MG PO CAPS: 2.0000 mg | ORAL_CAPSULE | Freq: Four times a day (QID) | ORAL | 0 refills | Status: DC | PRN

## 2018-06-29 MED ORDER — ONDANSETRON 8 MG PO TBDP: ORAL_TABLET | ORAL | 0 refills | Status: DC

## 2018-06-29 MED ORDER — POTASSIUM CHLORIDE 10 MEQ/100ML IV SOLN
10.0000 meq | Freq: Once | INTRAVENOUS | Status: AC
  Administered 2018-06-29: 06:00:00 10 meq via INTRAVENOUS
  Filled 2018-06-29: qty 100

## 2018-06-29 NOTE — Discharge Instructions (Addendum)
1. Medications: zofran, imodium, pepcid, usual home medications 2. Treatment: rest, drink plenty of fluids, advance diet slowly 3. Follow Up: Please followup with your primary doctor in 2 days for discussion of your diagnoses and further evaluation after today's visit; if you do not have a primary care doctor use the resource guide provided to find one; Please return to the ER for persistent vomiting, high fevers or worsening symptoms

## 2018-06-29 NOTE — ED Notes (Signed)
Bed: WE99 Expected date:  Expected time:  Means of arrival:  Comments: 23 year old food poisoning.

## 2018-06-29 NOTE — ED Notes (Addendum)
Walked pt to bathroom for sample pt flushed it. Says she did not understand

## 2018-06-29 NOTE — ED Triage Notes (Signed)
Pt comes to ed via ems, pt ate a hr ago, started vomiting and nausea. Alert x 4/ walks, from home. Iv 20 in LAC. 4 mg zofran.  V/s on arrival 121/77, hr 88, spo2 95, rr18, cbg 117, temp. 96.8 temp Pt chinese or [place] verbal ems.

## 2018-06-29 NOTE — ED Provider Notes (Signed)
[facility]EMERGENCY [facility]PT Provider Note   CSN: [id number] Arrival date & time: 06/29/18  0114    History   Chief Complaint Chief Complaint  Patient presents with  . Nausea  . Emesis    HPI Tamara Lowery is a 23 y.o. female with a hx of gestational diabetes presents to the Emergency Department complaining of gradual, persistent, progressively worsening epigastric abdominal pain with associated nausea and vomiting.  Patient reports this happened several hours prior to arrival after eating some questionable [place] food.  Patient reports persistent vomiting however epigastric pain has resolved.  She reports associated episodes of loose stools but no melena or hematochezia.  Patient denies fever, chills, headache, neck pain, chest pain, shortness of breath, weakness, dizziness, syncope, dysuria.  Patient denies possibility of pregnancy.  Last menstrual cycle is now.  No aggravating or alleviating factors.  No treatments prior to arrival.  Patient reports her boyfriend is sick with the same.     The history is provided by the patient and medical records. No language interpreter was used.    Past Medical History:  Diagnosis Date  . Diabetes mellitus without complication (HCC)   . Gestational diabetes   . Trichomonal infection     Patient Active Problem List   Diagnosis Date Noted  . Labor and delivery indication for care or intervention 10/02/2017  . Group B Streptococcus carrier, +RV culture, currently pregnant 09/08/2017  . GDM (gestational diabetes mellitus), class A1 08/19/2017  . Excessive weight gain 07/14/2017  . Low blood hemoglobin A2 03/24/2017  . Supervision of high risk pregnancy, antepartum 03/12/2017    Past Surgical History:  Procedure Laterality Date  . NO PAST SURGERIES       OB History    Gravida  1   Para      Term      Preterm      AB      Living        SAB      TAB      Ectopic      Multiple      Live  Births               Home Medications    Prior to Admission medications   Medication Sig Start Date End Date Taking? Authorizing Provider  famotidine (PEPCID) 20 MG tablet Take 1 tablet (20 mg total) by mouth 2 (two) times daily. 06/29/18   Bronte Kropf, Dahlia Client, PA-C  ibuprofen (ADVIL,MOTRIN) 600 MG tablet Take 1 tablet (600 mg total) by mouth every 6 (six) hours as needed. Patient not taking: Reported on 06/29/2018 10/05/17   Arabella Merles, CNM  loperamide (IMODIUM) 2 MG capsule Take 1 capsule (2 mg total) by mouth 4 (four) times daily as needed for diarrhea or loose stools. 06/29/18   Ilyas Lipsitz, Dahlia Client, PA-C  naproxen (NAPROSYN) 500 MG tablet Take 1 tablet (500 mg total) by mouth 2 (two) times daily. Patient not taking: Reported on 06/29/2018 11/30/17   Dahlia Byes A, NP  ondansetron (ZOFRAN ODT) 8 MG disintegrating tablet 8mg  ODT q4 hours prn nausea 06/29/18   Dyneisha Murchison, Dahlia Client, PA-C  potassium chloride SA (K-DUR) 20 MEQ tablet Take 1 tablet (20 mEq total) by mouth daily. 06/29/18   Naomie Crow, Dahlia Client, PA-C  Prenatal Multivit-Min-Fe-FA (PRENATAL VITAMINS) 0.8 MG tablet Take 1 tablet by mouth daily. Patient not taking: Reported on 06/29/2018 03/12/17   Marylene Land, CNM  traMADol (ULTRAM) 50 MG tablet Take 1 tablet (50 mg total)  by mouth every 6 (six) hours as needed. Patient not taking: Reported on 06/29/2018 11/30/17   Janace ArisBast, Traci A, NP    Family History No family history on file.  Social History Social History   Tobacco Use  . Smoking status: Former Smoker    Packs/day: 0.02    Types: Cigarettes  . Smokeless tobacco: Never Used  Substance Use Topics  . Alcohol use: No  . Drug use: Yes    Types: Marijuana    Comment: stopped when found out she was pregnant     Allergies   Patient has no known allergies.   Review of Systems Review of Systems  Constitutional: Negative for appetite change, diaphoresis, fatigue, fever and unexpected weight change.  HENT:  Negative for mouth sores.   Eyes: Negative for visual disturbance.  Respiratory: Negative for cough, chest tightness, shortness of breath and wheezing.   Cardiovascular: Negative for chest pain.  Gastrointestinal: Positive for abdominal pain (resolved), diarrhea, nausea and vomiting. Negative for constipation.  Endocrine: Negative for polydipsia, polyphagia and polyuria.  Genitourinary: Negative for dysuria, frequency, hematuria and urgency.  Musculoskeletal: Negative for back pain and neck stiffness.  Skin: Negative for rash.  Allergic/Immunologic: Negative for immunocompromised state.  Neurological: Negative for syncope, light-headedness and headaches.  Hematological: Does not bruise/bleed easily.  Psychiatric/Behavioral: Negative for sleep disturbance. The patient is not nervous/anxious.      Physical Exam Updated Vital Signs BP 128/80 (BP Location: Right Arm)   Pulse (!) 104   Temp 98.7 F (37.1 C) (Oral)   Resp 18   Ht 5\' 8"  (1.727 m)   Wt 81.6 kg   SpO2 95%   BMI 27.37 kg/m   Physical Exam Vitals signs and nursing note reviewed.  Constitutional:      General: She is not in acute distress.    Appearance: She is not diaphoretic.  HENT:     Head: Normocephalic.  Eyes:     General: No scleral icterus.    Conjunctiva/sclera: Conjunctivae normal.  Neck:     Musculoskeletal: Normal range of motion.  Cardiovascular:     Rate and Rhythm: Regular rhythm. Tachycardia present.     Pulses: Normal pulses.          Radial pulses are 2+ on the right side and 2+ on the left side.  Pulmonary:     Effort: No tachypnea, accessory muscle usage, prolonged expiration, respiratory distress or retractions.     Breath sounds: No stridor.     Comments: Equal chest rise. No increased work of breathing. Abdominal:     General: There is no distension.     Palpations: Abdomen is soft.     Tenderness: There is no abdominal tenderness. There is no guarding or rebound.  Musculoskeletal:      Comments: Moves all extremities equally and without difficulty.  Skin:    General: Skin is warm and dry.     Capillary Refill: Capillary refill takes less than 2 seconds.  Neurological:     Mental Status: She is alert.     GCS: GCS eye subscore is 4. GCS verbal subscore is 5. GCS motor subscore is 6.     Comments: Speech is clear and goal oriented.  Psychiatric:        Mood and Affect: Mood normal.      ED Treatments / Results  Labs (all labs ordered are listed, but only abnormal results are displayed) Labs Reviewed  CBC WITH DIFFERENTIAL/PLATELET - Abnormal; Notable for the following  components:      Result Value   WBC 19.7 (*)    RBC 5.24 (*)    Hemoglobin 16.1 (*)    HCT 49.7 (*)    Neutro Abs 17.1 (*)    Monocytes Absolute 1.3 (*)    All other components within normal limits  COMPREHENSIVE METABOLIC PANEL - Abnormal; Notable for the following components:   Potassium 3.3 (*)    CO2 20 (*)    Glucose, Bld 139 (*)    Total Protein 8.5 (*)    Albumin 5.1 (*)    All other components within normal limits  URINALYSIS, ROUTINE W REFLEX MICROSCOPIC - Abnormal; Notable for the following components:   Hgb urine dipstick MODERATE (*)    Bacteria, UA RARE (*)    All other components within normal limits  RAPID URINE DRUG SCREEN, HOSP PERFORMED - Abnormal; Notable for the following components:   Amphetamines POSITIVE (*)    Tetrahydrocannabinol POSITIVE (*)    All other components within normal limits  LIPASE, BLOOD  ETHANOL  I-STAT BETA HCG BLOOD, ED ([facility], [facility], [facility] ONLY)     Procedures Procedures (including critical care time)  Medications Ordered in ED Medications  sodium chloride 0.9 % bolus 1,000 mL (0 mLs Intravenous Stopped 06/29/18 0442)  ondansetron (ZOFRAN) injection 4 mg (4 mg Intravenous Given 06/29/18 0311)  sodium chloride 0.9 % bolus 1,000 mL (0 mLs Intravenous Stopped 06/29/18 0631)  promethazine (PHENERGAN) injection 25 mg (25 mg Intravenous Given 06/29/18 0442)   loperamide (IMODIUM) capsule 4 mg (4 mg Oral Given 06/29/18 0442)  potassium chloride 10 mEq in 100 mL IVPB (0 mEq Intravenous Stopped 06/29/18 0648)     Initial Impression / Assessment and Plan / ED Course  I have reviewed the triage vital signs and the nursing notes.  Pertinent labs & imaging results that were available during my care of the patient were reviewed by me and considered in my medical decision making (see chart for details).  Clinical Course as of Jun 28 712  Wed Jun 29, 2018  0410 Pt continues to vomit and have diarrhea.  Will given phenergan and imodium   [HM]  0553 No vomiting at this time.  Abdomen soft and nontender.  Patient has tolerated water.  Diarrhea has slowed.   [HM]  0553 Tachycardia resolved after fluid bolus  Pulse Rate: 78 [HM]  0554 Patient given potassium for hypokalemia  Potassium(!): 3.3 [HM]  0554 Significant leukocytosis however suspect this is a combination of dehydration and reactive as she is also hemoconcentrated with elevated hemoglobin.   WBC(!): 19.7 [HM]    Clinical Course User Index [HM] Essie Lagunes, Dahlia Client, PA-C        Patient presents with nausea, vomiting and diarrhea after questionable food intake.  Abdomen is soft and nontender on exam.  She is tachycardic.  We will give fluids, check labs and reassess.  5:55 AM  Patient with improvement.  Labs show leukocytosis and hemoconcentration.  Urinalysis pending.  She is afebrile, abdomen is soft and nontender and tachycardia has resolved.  No evidence of sirs or sepsis.  Suspect gastroenteritis versus infectious abdominal pathology.  Doubt pancreatitis, hepatitis, appendicitis, cholecystitis.   7:10 AM No evidence of UTI.  UDS + for amphetamines and marijuana.  Suspect this is some of the etiology.  Pt abd remains soft and nontender.  She has tolerated PO without difficulty.  Will d/c home with zofran, imodium and pepcid.  Hypokalemia treated with potassium x 3 days.  Final Clinical  Impressions(s) / ED Diagnoses   Final diagnoses:  Nausea vomiting and diarrhea  Marijuana abuse  Hypokalemia    ED Discharge Orders         Ordered    ondansetron (ZOFRAN ODT) 8 MG disintegrating tablet     06/29/18 0711    famotidine (PEPCID) 20 MG tablet  2 times daily     06/29/18 0711    loperamide (IMODIUM) 2 MG capsule  4 times daily PRN     06/29/18 0711    potassium chloride SA (K-DUR) 20 MEQ tablet  Daily     06/29/18 0713           Shinika Estelle, Dahlia Client, PA-C 06/29/18 0714    Molpus, John, MD 06/29/18 2240

## 2020-01-27 NOTE — L&D Delivery Note (Signed)
OB/GYN Faculty Practice Delivery Note  Tamara Lowery is a 25 y.o. G2P1001 s/p SVD at [redacted]w[redacted]d. She was admitted for IOL for post dates   ROM: 5h 63m with clear fluid GBS Status: Positive Maximum Maternal Temperature: 97.7  Labor Progress: Presented for IOL, received cytotec x2, and foley balloon, then once foley was out patient's membranes were artificially ruptured and she received pitocin and progressed to fully dilated.   Delivery Date/Time: 0140 on 09/09/2020 Delivery: Called to room and patient was complete and pushing. Head delivered spontaneous. No nuchal cord present. There was then a shoulder dystocia which was relieved with delivery of posterior arm and the body was delivered in usual fashion. Infant was found to have good tone but stunned therefore the cord was clamped and cut and infant was taken over to  the warmer. Cord blood was drawn and a cord gas was attempted to be collected, however placenta spontaneously delivered by patient while cord gas being collected. Cord gas collection was sent to lab however team told it was unable to be run.   Fundus firm with massage and Pitocin. Labia, perineum, vagina, and cervix inspected and found to have a hemostatic 1st degree and right sided lateral superficial periurethral laceration which was also hemostatic and therefore neither lacerations were repaired.  Placenta: Intact, 3V cord, sent to pathology Complications: Shoulder dystocia Lacerations: 1st degree and periurethral EBL: 50 Analgesia: None   Infant: female  APGARs 8, 39  3844g  Warner Mccreedy, MD, MPH OB Fellow, [facility] Center for [facility], [facility] 09/09/2020, 2:35 AM

## 2020-03-05 ENCOUNTER — Other Ambulatory Visit: Payer: Self-pay

## 2020-03-05 ENCOUNTER — Ambulatory Visit (INDEPENDENT_AMBULATORY_CARE_PROVIDER_SITE_OTHER): Payer: Medicaid Other | Admitting: General Practice

## 2020-03-05 DIAGNOSIS — Z3201 Encounter for pregnancy test, result positive: Secondary | ICD-10-CM

## 2020-03-05 LAB — POCT PREGNANCY, URINE: Preg Test, Ur: POSITIVE — AB

## 2020-03-05 NOTE — Progress Notes (Addendum)
Patient came into office and dropped off urine sample for UPT. UPT +. Attempted to call patient twice but received no answer, left message to call us back.  Chase Caller RN BSN 03/05/20   Chart reviewed for nurse visit. Agree with plan of care.   Currie Paris, NP 03/05/2020 9:19 PM

## 2020-03-12 ENCOUNTER — Other Ambulatory Visit (HOSPITAL_COMMUNITY)
Admission: RE | Admit: 2020-03-12 | Discharge: 2020-03-12 | Disposition: A | Discharge status: Home / Self Care | Payer: Medicaid Other | Source: Ambulatory Visit | Attending: Family Medicine | Admitting: Family Medicine

## 2020-03-12 ENCOUNTER — Encounter: Payer: Self-pay | Admitting: Certified Nurse Midwife

## 2020-03-12 ENCOUNTER — Ambulatory Visit (INDEPENDENT_AMBULATORY_CARE_PROVIDER_SITE_OTHER): Payer: Medicaid Other | Admitting: Certified Nurse Midwife

## 2020-03-12 ENCOUNTER — Other Ambulatory Visit: Payer: Self-pay

## 2020-03-12 ENCOUNTER — Encounter: Payer: Self-pay | Admitting: Family Medicine

## 2020-03-12 VITALS — BP 125/75 | HR 90 | Wt 187.1 lb

## 2020-03-12 DIAGNOSIS — O98312 Other infections with a predominantly sexual mode of transmission complicating pregnancy, second trimester: Secondary | ICD-10-CM

## 2020-03-12 DIAGNOSIS — A568 Sexually transmitted chlamydial infection of other sites: Secondary | ICD-10-CM

## 2020-03-12 DIAGNOSIS — Z3A19 19 weeks gestation of pregnancy: Secondary | ICD-10-CM

## 2020-03-12 DIAGNOSIS — Z8632 Personal history of gestational diabetes: Secondary | ICD-10-CM | POA: Insufficient documentation

## 2020-03-12 DIAGNOSIS — Z3480 Encounter for supervision of other normal pregnancy, unspecified trimester: Secondary | ICD-10-CM | POA: Insufficient documentation

## 2020-03-12 DIAGNOSIS — O09299 Supervision of pregnancy with other poor reproductive or obstetric history, unspecified trimester: Secondary | ICD-10-CM

## 2020-03-12 DIAGNOSIS — O0932 Supervision of pregnancy with insufficient antenatal care, second trimester: Secondary | ICD-10-CM | POA: Insufficient documentation

## 2020-03-12 HISTORY — DX: Personal history of gestational diabetes: Z86.32

## 2020-03-12 NOTE — Progress Notes (Signed)
Declined Flu Vaccine

## 2020-03-12 NOTE — Patient Instructions (Signed)

## 2020-03-12 NOTE — Progress Notes (Signed)
History:   Tamara Lowery is a 25 y.o. G2P1001 at [redacted]w[redacted]d by LMP being seen today for her first obstetrical visit.  Her obstetrical history is significant for history of gestational diabetes. Patient does not intend to breast feed. Pregnancy history fully reviewed.  Patient reports no complaints.     HISTORY: OB History  Gravida Para Term Preterm AB Living  2 1 1  0 0 1  SAB IAB Ectopic Multiple Live Births  0 0 0 0 1    # Outcome Date GA Lbr Len/2nd Weight Sex Delivery Anes PTL Lv  2 Current           1 Term 10/03/17 [redacted]w[redacted]d  6 lb 9.5 oz (2.991 kg) F Vag-Spont EPI N LIV   Past Medical History:  Diagnosis Date  . Diabetes mellitus without complication (HCC)   . Excessive weight gain 07/14/2017   50 pounds at 28 weeks  . GDM (gestational diabetes mellitus), class A1 08/19/2017   Current Diabetic Medications:  None  [ ]  Aspirin 81 mg daily after 12 weeks (? A2/B GDM)  Required Referrals for A1GDM or A2GDM: [X]  Diabetes Education and Testing Supplies [X]  Nutrition Cousult  For A2/B GDM or higher classes of DM [ ]  Diabetes Education and Testing Supplies [ ]  Nutrition Counsult [ ]  Fetal ECHO after 20 weeks  [ ]  Eye exam for retina evaluation   Baseline and surveillance labs (  . Gestational diabetes   . Group B Streptococcus carrier, +RV culture, currently pregnant 09/08/2017   Needs treatment in labor  . Labor and delivery indication for care or intervention 10/02/2017  . Low blood hemoglobin A2 03/24/2017   Discussed with patient. Declines genetic testing  . Supervision of high risk pregnancy, antepartum 03/12/2017     Clinic  [facility] Prenatal Labs Dating  LMP Blood type:   A pos Genetic Screen 1 Screen: normal   AFP:     Quad:     NIPS: Antibody:  neg Anatomic  normal Rubella:  immune GTT Early:               Third trimester: 93/106/127 RPR:   NR Flu vaccine  declines HBsAg:   neg TDaP vaccine  07/14/17                       Rhogam: NA HIV:  NR Baby Food  breastfeed                                             . Trichomonal infection    Past Surgical History:  Procedure Laterality Date  . NO PAST SURGERIES     History reviewed. No pertinent family history. Social History   Tobacco Use  . Smoking status: Former Smoker    Packs/day: 0.02    Types: Cigarettes  . Smokeless tobacco: Never Used  Vaping Use  . Vaping Use: Never used  Substance Use Topics  . Alcohol use: No  . Drug use: Yes    Types: Marijuana    Comment: stopped when found out she was pregnant in December    No Known Allergies Current Outpatient Medications on File Prior to Visit  Medication Sig Dispense Refill  . Prenatal Multivit-Min-Fe-FA (PRENATAL VITAMINS) 0.8 MG tablet Take 1 tablet by mouth daily. 30 tablet 12   No current facility-administered medications on file prior  to visit.    Review of Systems Pertinent items noted in HPI and remainder of comprehensive ROS otherwise negative.  Physical Exam:   Vitals:   03/12/20 1427  BP: 125/75  Pulse: 90  Weight: 187 lb 1.6 oz (84.9 kg)   Fetal Heart Rate (bpm): 150  General: well-developed, well-nourished female in no acute distress  Breasts:  normal appearance, no masses or tenderness bilaterally  Skin: normal coloration and turgor, no rashes  Neurologic: oriented, normal, negative, normal mood  Extremities: normal strength, tone, and muscle mass, ROM of all joints is normal  HEENT PERRLA, extraocular movement intact and sclera clear  Neck supple and no masses  Cardiovascular: regular rate and rhythm  Respiratory:  no respiratory distress, normal breath sounds  Abdomen: soft, non-tender; bowel sounds normal; no masses,  no organomegaly, gravid appropriate for gestational care   Pelvic: normal external genitalia, no lesions, blind swabs performed.  Bimanual exam: Cervix 0/long/high, firm, posterior    Assessment:    Pregnancy: G2P1001 Patient Active Problem List   Diagnosis Date Noted  . History of gestational diabetes  mellitus (GDM) in prior pregnancy, currently pregnant 03/12/2020     Plan:    1. Supervision of other normal pregnancy, antepartum - Welcomed to practice and introduced self to patient  - Reviewed safety, visitor policy, reassurance about COVID-19 for pregnancy at this time. Discussed possible changes to visits, including televisits, that may occur due to COVID-19.  The office remains open if pt needs to be seen and MAU is open 24 hours/day for OB emergencies. - Anticipatory guidance on upcoming appointments  - [facility] AMB BABYSCRIPTS SCHEDULE OPTIMIZATION - Culture, OB Urine - Genetic Screening - US MFM OB COMP + 14 WK; Future - Hemoglobin A1c - Cervicovaginal ancillary only( [place]) - AFP, Serum, Open Spina Bifida  2. History of gestational diabetes mellitus (GDM) in prior pregnancy, currently pregnant - A1c obtained today  3. [redacted] weeks gestation of pregnancy  4. Late prenatal care affecting pregnancy in second trimester - Care initiated at [redacted]w[redacted]d, patient reports that is it due to transportation issues. Transportation is set up through [facility] during today's visit    Initial labs drawn. Continue prenatal vitamins. Problem list reviewed and updated. Genetic Screening discussed, AFP and NIPS: ordered. Ultrasound discussed; fetal anatomic survey: ordered. Anticipatory guidance about prenatal visits given including labs, ultrasounds, and testing. Discussed usage of Babyscripts and virtual visits as additional source of managing and completing prenatal visits in midst of coronavirus and pandemic.   Encouraged to complete MyChart Registration for her ability to review results, send requests, and have questions addressed.  The nature of [facility] [facility] with multiple MDs and Advanced Practice Providers was explained to patient; also emphasized that residents, students are part of our team. Routine obstetric precautions reviewed. Encouraged to  seek out care at office or emergency room [facility] MAU preferred) for urgent and/or emergent concerns. Return in about 4 weeks (around 04/09/2020) for LROB, in person.     Sharyon Cable, CNM Center for [employer], [facility]

## 2020-03-12 NOTE — Progress Notes (Signed)
Home Medicaid complete  Praneeth Bussey B, CMA 03/12/20

## 2020-03-13 DIAGNOSIS — O98312 Other infections with a predominantly sexual mode of transmission complicating pregnancy, second trimester: Secondary | ICD-10-CM | POA: Insufficient documentation

## 2020-03-13 DIAGNOSIS — A568 Sexually transmitted chlamydial infection of other sites: Secondary | ICD-10-CM | POA: Insufficient documentation

## 2020-03-13 LAB — CERVICOVAGINAL ANCILLARY ONLY
Chlamydia: POSITIVE — AB
Comment: NEGATIVE
Comment: NEGATIVE
Comment: NORMAL
Neisseria Gonorrhea: NEGATIVE
Trichomonas: NEGATIVE

## 2020-03-13 MED ORDER — AZITHROMYCIN 500 MG PO TABS: 1000.0000 mg | ORAL_TABLET | Freq: Once | ORAL | 0 refills | Status: AC

## 2020-03-13 NOTE — Addendum Note (Signed)
Addended by: Sharyon Cable on: 03/13/2020 02:03 PM   Modules accepted: Orders

## 2020-03-14 ENCOUNTER — Encounter: Payer: Self-pay | Admitting: *Deleted

## 2020-03-14 LAB — AFP, SERUM, OPEN SPINA BIFIDA
AFP MoM: 0.44
AFP Value: 20.3 ng/mL
Gest. Age on Collection Date: 19.4 weeks
Maternal Age At EDD: 23.8 yr
OSBR Risk 1 IN: 10000
Test Results:: NEGATIVE
Weight: 187 [lb_av]

## 2020-03-14 LAB — CULTURE, OB URINE

## 2020-03-14 LAB — HEMOGLOBIN A1C
Est. average glucose Bld gHb Est-mCnc: 91 mg/dL
Hgb A1c MFr Bld: 4.8 % (ref 4.8–5.6)

## 2020-03-14 LAB — URINE CULTURE, OB REFLEX

## 2020-03-20 ENCOUNTER — Telehealth: Payer: Self-pay

## 2020-03-20 NOTE — Telephone Encounter (Signed)
   Tamara Lowery DOB: 1995-04-17 MRN: [id number]   RIDER WAIVER AND RELEASE OF LIABILITY  For purposes of improving physical access to our facilities, [facility] is pleased to partner with third parties to provide [facility] patients or other authorized individuals the option of convenient, on-demand ground transportation services (the [employer]") through use of the technology service that enables users to request on-demand ground transportation from independent third-party providers.  By opting to use and accept these [employer], I, the undersigned, hereby agree on behalf of myself, and on behalf of any minor child using the [employer] for whom I am the parent or legal guardian, as follows:  1. Science writer provided to me are provided by independent third-party transportation providers who are not [employer] or employees and who are unaffiliated with [employer]. 2. [place] is neither a transportation carrier nor a common or public carrier. 3. [facility] has no control over the quality or safety of the transportation that occurs as a result of the [employer]. 4. [place] cannot guarantee that any third-party transportation provider will complete any arranged transportation service. 5. [facility] makes no representation, warranty, or guarantee regarding the reliability, timeliness, quality, safety, suitability, or availability of any of the Transport Services or that they will be error free. 6. I fully understand that traveling by vehicle involves risks and dangers of serious bodily injury, including permanent disability, paralysis, and death. I agree, on behalf of myself and on behalf of any minor child using the Transport Services for whom I am the parent or legal guardian, that the entire risk arising out of my use of the [employer] remains solely with me, to the maximum extent permitted under applicable law. 7. The  [employer] are provided "as is" and "as available." [place] disclaims all representations and warranties, express, implied or statutory, not expressly set out in these terms, including the implied warranties of merchantability and fitness for a particular purpose. 8. I hereby waive and release [place], its agents, employees, officers, directors, representatives, insurers, attorneys, assigns, successors, subsidiaries, and affiliates from any and all past, present, or future claims, demands, liabilities, actions, causes of action, or suits of any kind directly or indirectly arising from acceptance and use of the [employer]. 9. I further waive and release [place] and its affiliates from all present and future liability and responsibility for any injury or death to persons or damages to property caused by or related to the use of the [employer]. 10. I have read this Waiver and Release of Liability, and I understand the terms used in it and their legal significance. This Waiver is freely and voluntarily given with the understanding that my right (as well as the right of any minor child for whom I am the parent or legal guardian using the [employer]) to legal recourse against [place] in connection with the [employer] is knowingly surrendered in return for use of these services.   I attest that I read the consent document to Tamara Lowery, gave Ms. Lowery the opportunity to ask questions and answered the questions asked (if any). I affirm that Tamara Lowery then provided consent for she's participation in this program.     Evette Doffing

## 2020-03-21 ENCOUNTER — Ambulatory Visit: Payer: Medicaid Other | Attending: Certified Nurse Midwife

## 2020-03-21 ENCOUNTER — Other Ambulatory Visit: Payer: Medicaid Other

## 2020-03-21 ENCOUNTER — Other Ambulatory Visit: Payer: Self-pay | Admitting: *Deleted

## 2020-03-21 ENCOUNTER — Other Ambulatory Visit: Payer: Self-pay

## 2020-03-21 DIAGNOSIS — Z3480 Encounter for supervision of other normal pregnancy, unspecified trimester: Secondary | ICD-10-CM | POA: Insufficient documentation

## 2020-03-21 DIAGNOSIS — Z362 Encounter for other antenatal screening follow-up: Secondary | ICD-10-CM

## 2020-03-22 LAB — CBC/D/PLT+RPR+RH+ABO+RUB AB...
Antibody Screen: NEGATIVE
Basophils Absolute: 0.1 10*3/uL (ref 0.0–0.2)
Basos: 1 %
EOS (ABSOLUTE): 0.4 10*3/uL (ref 0.0–0.4)
Eos: 3 %
HCV Ab: <0.1 s/co ratio (ref 0.0–0.9)
HIV Screen 4th Generation wRfx: NONREACTIVE
Hematocrit: 38.8 % (ref 34.0–46.6)
Hemoglobin: 12.9 g/dL (ref 11.1–15.9)
Hepatitis B Surface Ag: NEGATIVE
Immature Grans (Abs): 0 10*3/uL (ref 0.0–0.1)
Immature Granulocytes: 0 %
Lymphocytes Absolute: 2.6 10*3/uL (ref 0.7–3.1)
Lymphs: 23 %
MCH: 31.9 pg (ref 26.6–33.0)
MCHC: 33.2 g/dL (ref 31.5–35.7)
MCV: 96 fL (ref 79–97)
Monocytes Absolute: 0.6 10*3/uL (ref 0.1–0.9)
Monocytes: 5 %
Neutrophils Absolute: 8 10*3/uL — ABNORMAL HIGH (ref 1.4–7.0)
Neutrophils: 68 %
Platelets: 288 10*3/uL (ref 150–450)
RBC: 4.05 x10E6/uL (ref 3.77–5.28)
RDW: 12.2 % (ref 11.7–15.4)
RPR Ser Ql: NONREACTIVE
Rh Factor: POSITIVE
Rubella Antibodies, IGG: 3.23 index (ref >0.99)
WBC: 11.6 10*3/uL — ABNORMAL HIGH (ref 3.4–10.8)

## 2020-03-22 LAB — HCV INTERPRETATION

## 2020-04-03 ENCOUNTER — Encounter: Payer: Self-pay | Admitting: *Deleted

## 2020-04-11 ENCOUNTER — Other Ambulatory Visit: Payer: Self-pay

## 2020-04-11 ENCOUNTER — Ambulatory Visit: Payer: Medicaid Other | Attending: Obstetrics

## 2020-04-11 ENCOUNTER — Other Ambulatory Visit (HOSPITAL_COMMUNITY)
Admission: RE | Admit: 2020-04-11 | Discharge: 2020-04-11 | Disposition: A | Discharge status: Home / Self Care | Payer: Medicaid Other | Source: Ambulatory Visit | Attending: Obstetrics and Gynecology | Admitting: Obstetrics and Gynecology

## 2020-04-11 ENCOUNTER — Encounter: Payer: Self-pay | Admitting: *Deleted

## 2020-04-11 ENCOUNTER — Ambulatory Visit: Payer: Medicaid Other | Admitting: *Deleted

## 2020-04-11 ENCOUNTER — Other Ambulatory Visit: Payer: Self-pay | Admitting: Obstetrics

## 2020-04-11 ENCOUNTER — Ambulatory Visit (INDEPENDENT_AMBULATORY_CARE_PROVIDER_SITE_OTHER): Payer: Medicaid Other | Admitting: Obstetrics and Gynecology

## 2020-04-11 VITALS — BP 114/55 | HR 79

## 2020-04-11 VITALS — BP 126/65 | HR 79 | Wt 197.3 lb

## 2020-04-11 DIAGNOSIS — Z362 Encounter for other antenatal screening follow-up: Secondary | ICD-10-CM | POA: Diagnosis present

## 2020-04-11 DIAGNOSIS — A568 Sexually transmitted chlamydial infection of other sites: Secondary | ICD-10-CM

## 2020-04-11 DIAGNOSIS — Z3A19 19 weeks gestation of pregnancy: Secondary | ICD-10-CM

## 2020-04-11 DIAGNOSIS — O98312 Other infections with a predominantly sexual mode of transmission complicating pregnancy, second trimester: Secondary | ICD-10-CM

## 2020-04-11 DIAGNOSIS — O0932 Supervision of pregnancy with insufficient antenatal care, second trimester: Secondary | ICD-10-CM | POA: Diagnosis present

## 2020-04-11 DIAGNOSIS — O09299 Supervision of pregnancy with other poor reproductive or obstetric history, unspecified trimester: Secondary | ICD-10-CM

## 2020-04-11 DIAGNOSIS — Z3480 Encounter for supervision of other normal pregnancy, unspecified trimester: Secondary | ICD-10-CM

## 2020-04-11 DIAGNOSIS — Z8632 Personal history of gestational diabetes: Secondary | ICD-10-CM

## 2020-04-11 MED ORDER — BLOOD PRESSURE KIT DEVI: 1.0000 | 0 refills | Status: DC | PRN

## 2020-04-11 MED ORDER — PRENATAL 27-0.8 MG PO TABS: 1.0000 | ORAL_TABLET | Freq: Every day | ORAL | 11 refills | Status: DC

## 2020-04-11 NOTE — Patient Instructions (Signed)

## 2020-04-11 NOTE — Progress Notes (Signed)
   PRENATAL VISIT NOTE  Subjective:  Tamara Lowery is a 25 y.o. G2P1001 at 40w4dbeing seen today for ongoing prenatal care.  She is currently monitored for the following issues for this low-risk pregnancy and has History of gestational diabetes mellitus (GDM) in prior pregnancy, currently pregnant; Late prenatal care affecting pregnancy in second trimester; Supervision of other normal pregnancy, antepartum; Chlamydia trachomatis infection in mother during second trimester of pregnancy; and [redacted] weeks gestation of pregnancy on their problem list.  Patient doing well with no acute concerns today. She reports no complaints.  Contractions: Not present. Vag. Bleeding: None.  Movement: Present. Denies leaking of fluid.   The following portions of the patient's history were reviewed and updated as appropriate: allergies, current medications, past family history, past medical history, past social history, past surgical history and problem list. Problem list updated.  Objective:   Vitals:   04/11/20 1313  BP: 126/65  Pulse: 79  Weight: 197 lb 4.8 oz (89.5 kg)    Fetal Status: Fetal Heart Rate (bpm): 136   Movement: Present     General:  Alert, oriented and cooperative. Patient is in no acute distress.  Skin: Skin is warm and dry. No rash noted.   Cardiovascular: Normal heart rate noted  Respiratory: Normal respiratory effort, no problems with respiration noted  Abdomen: Soft, gravid, appropriate for gestational age.  Pain/Pressure: Absent     Pelvic: Cervical exam deferred        Extremities: Normal range of motion.  Edema: None  Mental Status:  Normal mood and affect. Normal behavior. Normal judgment and thought content.   Assessment and Plan:  Pregnancy: G2P1001 at 151w4d1. Supervision of other normal pregnancy, antepartum Anatomy scan completed today  - Prenatal Vit-Fe Fumarate-FA (MULTIVITAMIN-PRENATAL) 27-0.8 MG TABS tablet; Take 1 tablet by mouth daily at 12 noon.   Dispense: 30 tablet; Refill: 11 - Blood Pressure Monitoring (BLOOD PRESSURE KIT) DEVI; 1 Device by Does not apply route as needed.  Dispense: 1 each; Refill: 0  2. Chlamydia trachomatis infection in mother during second trimester of pregnancy  - Cervicovaginal ancillary only( [place])  3. History of gestational diabetes mellitus (GDM) in prior pregnancy, currently pregnant   4. Late prenatal care affecting pregnancy in second trimester   5. [redacted] weeks gestation of pregnancy   Preterm labor symptoms and general obstetric precautions including but not limited to vaginal bleeding, contractions, leaking of fluid and fetal movement were reviewed in detail with the patient.  Please refer to After Visit Summary for other counseling recommendations.   Return in about 4 weeks (around 05/09/2020) for ROB, in person.   LaLynnda ShieldsMD Faculty Attending [facility]

## 2020-04-11 NOTE — Progress Notes (Signed)
PNV RX sent in per pt. Request.  RX for bp cuff sent to [facility]. Declines flu shot.  Kamon Fahr,RN

## 2020-04-15 LAB — CERVICOVAGINAL ANCILLARY ONLY
Chlamydia: NEGATIVE
Comment: NEGATIVE
Comment: NORMAL
Neisseria Gonorrhea: NEGATIVE

## 2020-05-07 ENCOUNTER — Encounter: Payer: Self-pay | Admitting: General Practice

## 2020-05-09 ENCOUNTER — Ambulatory Visit (INDEPENDENT_AMBULATORY_CARE_PROVIDER_SITE_OTHER): Payer: Medicaid Other | Admitting: Student

## 2020-05-09 ENCOUNTER — Other Ambulatory Visit: Payer: Self-pay

## 2020-05-09 VITALS — BP 116/70 | HR 89 | Wt 213.8 lb

## 2020-05-09 DIAGNOSIS — Z3A23 23 weeks gestation of pregnancy: Secondary | ICD-10-CM

## 2020-05-09 DIAGNOSIS — O99210 Obesity complicating pregnancy, unspecified trimester: Secondary | ICD-10-CM | POA: Insufficient documentation

## 2020-05-09 NOTE — Progress Notes (Signed)
   PRENATAL VISIT NOTE  Subjective:  Tamara Lowery is a 25 y.o. G2P1001 at [redacted]w[redacted]d being seen today for ongoing prenatal care.  She is currently monitored for the following issues for this low-risk pregnancy and has History of gestational diabetes mellitus (GDM) in prior pregnancy, currently pregnant; Late prenatal care affecting pregnancy in second trimester; Supervision of other normal pregnancy, antepartum; Chlamydia trachomatis infection in mother during second trimester of pregnancy; [redacted] weeks gestation of pregnancy; and Maternal obesity affecting pregnancy, antepartum on their problem list.  Patient reports no complaints.  Contractions: Not present. Vag. Bleeding: None.  Movement: Present. Denies leaking of fluid.   The following portions of the patient's history were reviewed and updated as appropriate: allergies, current medications, past family history, past medical history, past social history, past surgical history and problem list.   Objective:   Vitals:   05/09/20 1548  BP: 116/70  Pulse: 89  Weight: 213 lb 12.8 oz (97 kg)    Fetal Status: Fetal Heart Rate (bpm): 145 Fundal Height: 38 cm Movement: Present     General:  Alert, oriented and cooperative. Patient is in no acute distress.  Skin: Skin is warm and dry. No rash noted.   Cardiovascular: Normal heart rate noted  Respiratory: Normal respiratory effort, no problems with respiration noted  Abdomen: Soft, gravid, appropriate for gestational age.  Pain/Pressure: Present     Pelvic: Cervical exam deferred        Extremities: Normal range of motion.  Edema: None  Mental Status: Normal mood and affect. Normal behavior. Normal judgment and thought content.   Assessment and Plan:  Pregnancy: G2P1001 at [redacted]w[redacted]d  1. Maternal obesity affecting pregnancy, antepartum   2. [redacted] weeks gestation of pregnancy   -reviewed GC/CT results, negative -large FH; difficult to assess uterine size so will order growth scan due to  obesity -guidance given for 28 week appointment -patient does not want birth control; keep talking about it!  Preterm labor symptoms and general obstetric precautions including but not limited to vaginal bleeding, contractions, leaking of fluid and fetal movement were reviewed in detail with the patient. Please refer to After Visit Summary for other counseling recommendations.   Return in about 3 weeks (around 05/30/2020), or in person for 28 week labs with HROB.  No future appointments.  Marylene Land, CNM

## 2020-05-09 NOTE — Patient Instructions (Signed)
Oral Glucose Tolerance Test During Pregnancy Why am I having this test? The oral glucose tolerance test (OGTT) is done to check how your body processes blood sugar (glucose). This is one of several tests used to diagnose diabetes that develops during pregnancy (gestational diabetes mellitus). Gestational diabetes is a short-term form of diabetes that some women develop while they are pregnant. It usually occurs during the second trimester of pregnancy and goes away after delivery. Testing, or screening, for gestational diabetes usually occurs at weeks 24-28 of pregnancy. You may have the OGTT test after having a 1-hour glucose screening test if the results from that test indicate that you may have gestational diabetes. This test may also be needed if:  You have a history of gestational diabetes.  There is a history of giving birth to very large babies or of losing pregnancies (having stillbirths).  You have signs and symptoms of diabetes, such as: ? Changes in your eyesight. ? Tingling or numbness in your hands or feet. ? Changes in hunger, thirst, and urination, and these are not explained by your pregnancy. What is being tested? This test measures the amount of glucose in your blood at different times during a period of 3 hours. This shows how well your body can process glucose. What kind of sample is taken? Blood samples are required for this test. They are usually collected by inserting a needle into a blood vessel.   How do I prepare for this test?  For 3 days before your test, eat normally. Have plenty of carbohydrate-rich foods.  Follow instructions from your health care provider about: ? Eating or drinking restrictions on the day of the test. You may be asked not to eat or drink anything other than water (to fast) starting 8-10 hours before the test. ? Changing or stopping your regular medicines. Some medicines may interfere with this test. Tell a health care provider about:  All  medicines you are taking, including vitamins, herbs, eye drops, creams, and over-the-counter medicines.  Any blood disorders you have.  Any surgeries you have had.  Any medical conditions you have. What happens during the test? First, your blood glucose will be measured. This is referred to as your fasting blood glucose because you fasted before the test. Then, you will drink a glucose solution that contains a certain amount of glucose. Your blood glucose will be measured again 1, 2, and 3 hours after you drink the solution. This test takes about 3 hours to complete. You will need to stay at the testing location during this time. During the testing period:  Do not eat or drink anything other than the glucose solution.  Do not exercise.  Do not use any products that contain nicotine or tobacco, such as cigarettes, e-cigarettes, and chewing tobacco. These can affect your test results. If you need help quitting, ask your health care provider. The testing procedure may vary among health care providers and hospitals. How are the results reported? Your results will be reported as milligrams of glucose per deciliter of blood (mg/dL) or millimoles per liter (mmol/L). There is more than one source for screening and diagnosis reference values used to diagnose gestational diabetes. Your health care provider will compare your results to normal values that were established after testing a large group of people (reference values). Reference values may vary among labs and hospitals. For this test ([place]ou[place]), reference values are:  Fasting: 95 mg/dL (5.3 mmol/L).  1 hour: 180 mg/dL (10.0 mmol/L).  2 hour:   155 mg/dL (8.6 mmol/L).  3 hour: 140 mg/dL (7.8 mmol/L). What do the results mean? Results below the reference values are considered normal. If two or more of your blood glucose levels are at or above the reference values, you may be diagnosed with gestational diabetes. If only one level is  high, your health care provider may suggest repeat testing or other tests to confirm a diagnosis. Talk with your health care provider about what your results mean. Questions to ask your health care provider Ask your health care provider, or the department that is doing the test:  When will my results be ready?  How will I get my results?  What are my treatment options?  What other tests do I need?  What are my next steps? Summary  The oral glucose tolerance test (OGTT) is one of several tests used to diagnose diabetes that develops during pregnancy (gestational diabetes mellitus). Gestational diabetes is a short-term form of diabetes that some women develop while they are pregnant.  You may have the OGTT test after having a 1-hour glucose screening test if the results from that test show that you may have gestational diabetes. You may also have this test if you have any symptoms or risk factors for this type of diabetes.  Talk with your health care provider about what your results mean. This information is not intended to replace advice given to you by your health care provider. Make sure you discuss any questions you have with your health care provider. Document Revised: 06/22/2019 Document Reviewed: 06/22/2019 Elsevier Patient Education  2021 Elsevier Inc.  

## 2020-05-22 ENCOUNTER — Ambulatory Visit: Payer: Medicaid Other | Admitting: *Deleted

## 2020-05-22 ENCOUNTER — Encounter: Payer: Self-pay | Admitting: *Deleted

## 2020-05-22 ENCOUNTER — Other Ambulatory Visit: Payer: Self-pay

## 2020-05-22 ENCOUNTER — Ambulatory Visit: Payer: Medicaid Other | Attending: Student

## 2020-05-22 VITALS — BP 112/57 | HR 81

## 2020-05-22 DIAGNOSIS — Z8632 Personal history of gestational diabetes: Secondary | ICD-10-CM

## 2020-05-22 DIAGNOSIS — O99210 Obesity complicating pregnancy, unspecified trimester: Secondary | ICD-10-CM | POA: Diagnosis present

## 2020-05-22 DIAGNOSIS — O0932 Supervision of pregnancy with insufficient antenatal care, second trimester: Secondary | ICD-10-CM

## 2020-05-22 DIAGNOSIS — O321XX0 Maternal care for breech presentation, not applicable or unspecified: Secondary | ICD-10-CM

## 2020-05-22 DIAGNOSIS — Z3A25 25 weeks gestation of pregnancy: Secondary | ICD-10-CM | POA: Diagnosis not present

## 2020-05-22 DIAGNOSIS — O26842 Uterine size-date discrepancy, second trimester: Secondary | ICD-10-CM

## 2020-05-30 ENCOUNTER — Other Ambulatory Visit: Payer: Medicaid Other

## 2020-05-30 ENCOUNTER — Encounter: Payer: Medicaid Other | Admitting: Family Medicine

## 2020-06-03 ENCOUNTER — Encounter: Payer: Medicaid Other | Admitting: Nurse Practitioner

## 2020-06-06 ENCOUNTER — Ambulatory Visit (INDEPENDENT_AMBULATORY_CARE_PROVIDER_SITE_OTHER): Payer: Medicaid Other | Admitting: Obstetrics and Gynecology

## 2020-06-06 ENCOUNTER — Other Ambulatory Visit: Payer: Self-pay

## 2020-06-06 ENCOUNTER — Other Ambulatory Visit: Payer: Medicaid Other

## 2020-06-06 ENCOUNTER — Other Ambulatory Visit: Payer: Self-pay | Admitting: *Deleted

## 2020-06-06 VITALS — BP 122/59 | HR 81 | Wt 224.6 lb

## 2020-06-06 DIAGNOSIS — Z23 Encounter for immunization: Secondary | ICD-10-CM

## 2020-06-06 DIAGNOSIS — Z3480 Encounter for supervision of other normal pregnancy, unspecified trimester: Secondary | ICD-10-CM | POA: Diagnosis not present

## 2020-06-06 DIAGNOSIS — Z3A27 27 weeks gestation of pregnancy: Secondary | ICD-10-CM

## 2020-06-06 MED ORDER — PREPLUS 27-1 MG PO TABS: 1.0000 | ORAL_TABLET | Freq: Every day | ORAL | 1 refills | Status: AC

## 2020-06-06 MED ORDER — PANTOPRAZOLE SODIUM 20 MG PO TBEC: 20.0000 mg | DELAYED_RELEASE_TABLET | Freq: Two times a day (BID) | ORAL | 0 refills | Status: DC

## 2020-06-06 NOTE — Addendum Note (Signed)
Addended by: [place] Bing on: 06/06/2020 10:09 AM   Modules accepted: Orders

## 2020-06-06 NOTE — Progress Notes (Signed)
   PRENATAL VISIT NOTE  Subjective:  Tamara Lowery is a 25 y.o. G2P1001 at [redacted]w[redacted]d being seen today for ongoing prenatal care.  She is currently monitored for the following issues for this low-risk pregnancy and has History of gestational diabetes mellitus (GDM) in prior pregnancy, currently pregnant; Late prenatal care affecting pregnancy in second trimester; Supervision of other normal pregnancy, antepartum; Chlamydia trachomatis infection in mother during second trimester of pregnancy; and Maternal obesity affecting pregnancy, antepartum on their problem list.  Patient reports no complaints.  Contractions: Not present. Vag. Bleeding: None.  Movement: Present. Denies leaking of fluid.   The following portions of the patient's history were reviewed and updated as appropriate: allergies, current medications, past family history, past medical history, past social history, past surgical history and problem list.   Objective:   Vitals:   06/06/20 0933  BP: (!) 122/59  Pulse: 81  Weight: 224 lb 9.6 oz (101.9 kg)    Fetal Status: Fetal Heart Rate (bpm): 145   Movement: Present     General:  Alert, oriented and cooperative. Patient is in no acute distress.  Skin: Skin is warm and dry. No rash noted.   Cardiovascular: Normal heart rate noted  Respiratory: Normal respiratory effort, no problems with respiration noted  Abdomen: Soft, gravid, appropriate for gestational age.  Pain/Pressure: Absent     Pelvic: Cervical exam deferred        Extremities: Normal range of motion.  Edema: None  Mental Status: Normal mood and affect. Normal behavior. Normal judgment and thought content.   Assessment and Plan:  Pregnancy: G2P1001 at [redacted]w[redacted]d 1. Supervision of other normal pregnancy, antepartum Routine care. Pt ate today so got all the other labs but not gtt. protonix sent in for gerd TWG 49lbs. Pt told to aim for 10lbs more for rest of pregnancy BC options d/w her.  - Tdap vaccine greater  than or equal to 7yo IM  2. [redacted] weeks gestation of pregnancy  Preterm labor symptoms and general obstetric precautions including but not limited to vaginal bleeding, contractions, leaking of fluid and fetal movement were reviewed in detail with the patient. Please refer to After Visit Summary for other counseling recommendations.   Return in about 2 weeks (around 06/20/2020) for low risk, in person, 2hr GTT, md or app.  Future Appointments  Date Time Provider Department Center  06/21/2020  8:20 AM [facility]    [place] Bing, MD

## 2020-06-11 ENCOUNTER — Other Ambulatory Visit: Payer: Self-pay | Admitting: Obstetrics and Gynecology

## 2020-06-11 LAB — CBC
Hematocrit: 31.9 % — ABNORMAL LOW (ref 34.0–46.6)
Hemoglobin: 10.9 g/dL — ABNORMAL LOW (ref 11.1–15.9)
MCH: 32.2 pg (ref 26.6–33.0)
MCHC: 34.2 g/dL (ref 31.5–35.7)
MCV: 94 fL (ref 79–97)
Platelets: 249 10*3/uL (ref 150–450)
RBC: 3.39 x10E6/uL — ABNORMAL LOW (ref 3.77–5.28)
RDW: 12.3 % (ref 11.7–15.4)
WBC: 9.7 10*3/uL (ref 3.4–10.8)

## 2020-06-11 LAB — RPR: RPR Ser Ql: NONREACTIVE

## 2020-06-11 LAB — HIV ANTIBODY (ROUTINE TESTING W REFLEX): HIV Screen 4th Generation wRfx: NONREACTIVE

## 2020-06-11 MED ORDER — FERROUS SULFATE 325 (65 FE) MG PO TABS: 325.0000 mg | ORAL_TABLET | ORAL | 0 refills | Status: AC

## 2020-06-11 MED ORDER — DOCUSATE SODIUM 100 MG PO CAPS: 100.0000 mg | ORAL_CAPSULE | Freq: Two times a day (BID) | ORAL | 2 refills | Status: DC | PRN

## 2020-06-21 ENCOUNTER — Other Ambulatory Visit: Payer: Medicaid Other

## 2020-06-21 ENCOUNTER — Other Ambulatory Visit: Payer: Self-pay

## 2020-06-21 DIAGNOSIS — Z3480 Encounter for supervision of other normal pregnancy, unspecified trimester: Secondary | ICD-10-CM

## 2020-06-22 LAB — GLUCOSE TOLERANCE, 2 HOURS W/ 1HR
Glucose, 1 hour: 119 mg/dL (ref 65–179)
Glucose, 2 hour: 104 mg/dL (ref 65–152)
Glucose, Fasting: 80 mg/dL (ref 65–91)

## 2020-06-25 ENCOUNTER — Encounter: Payer: Self-pay | Admitting: Obstetrics and Gynecology

## 2020-08-14 ENCOUNTER — Other Ambulatory Visit (HOSPITAL_COMMUNITY)
Admission: RE | Admit: 2020-08-14 | Discharge: 2020-08-14 | Disposition: A | Discharge status: Home / Self Care | Payer: Medicaid Other | Source: Ambulatory Visit | Attending: Obstetrics and Gynecology | Admitting: Obstetrics and Gynecology

## 2020-08-14 ENCOUNTER — Other Ambulatory Visit: Payer: Self-pay

## 2020-08-14 ENCOUNTER — Ambulatory Visit (INDEPENDENT_AMBULATORY_CARE_PROVIDER_SITE_OTHER): Payer: Medicaid Other | Admitting: Obstetrics and Gynecology

## 2020-08-14 VITALS — BP 104/70 | HR 80 | Wt 232.8 lb

## 2020-08-14 DIAGNOSIS — Z3A37 37 weeks gestation of pregnancy: Secondary | ICD-10-CM | POA: Insufficient documentation

## 2020-08-14 DIAGNOSIS — Z3480 Encounter for supervision of other normal pregnancy, unspecified trimester: Secondary | ICD-10-CM | POA: Diagnosis present

## 2020-08-14 NOTE — Progress Notes (Signed)
   PRENATAL VISIT NOTE  Subjective:  Tamara Lowery is a 25 y.o. G2P1001 at [redacted]w[redacted]d being seen today for ongoing prenatal care.  She is currently monitored for the following issues for this low-risk pregnancy and has Late prenatal care affecting pregnancy in second trimester; Supervision of other normal pregnancy, antepartum; Chlamydia trachomatis infection in mother during second trimester of pregnancy; Maternal obesity affecting pregnancy, antepartum; and [redacted] weeks gestation of pregnancy on their problem list.  Patient doing well with no acute concerns today. She reports no complaints.  Contractions: Irritability. Vag. Bleeding: None.  Movement: Present. Denies leaking of fluid.   The following portions of the patient's history were reviewed and updated as appropriate: allergies, current medications, past family history, past medical history, past social history, past surgical history and problem list. Problem list updated.  Objective:   Vitals:   08/14/20 1521  BP: 104/70  Pulse: 80  Weight: 232 lb 12.8 oz (105.6 kg)    Fetal Status: Fetal Heart Rate (bpm): 140 Fundal Height: 37 cm Movement: Present     General:  Alert, oriented and cooperative. Patient is in no acute distress.  Skin: Skin is warm and dry. No rash noted.   Cardiovascular: Normal heart rate noted  Respiratory: Normal respiratory effort, no problems with respiration noted  Abdomen: Soft, gravid, appropriate for gestational age.  Pain/Pressure: Present     Pelvic: Cervical exam performed Dilation: Fingertip Effacement (%): 50 Station: Ballotable  Extremities: Normal range of motion.  Edema: Trace  Mental Status:  Normal mood and affect. Normal behavior. Normal judgment and thought content.   Assessment and Plan:  Pregnancy: G2P1001 at [redacted]w[redacted]d  1. Supervision of other normal pregnancy, antepartum Continue routine care, can discuss IOL at 39 week visit - GC/Chlamydia probe amp ([place])not at [facility] -  Culture, beta strep (group b only)  2. [redacted] weeks gestation of pregnancy   Term labor symptoms and general obstetric precautions including but not limited to vaginal bleeding, contractions, leaking of fluid and fetal movement were reviewed in detail with the patient.  Please refer to After Visit Summary for other counseling recommendations.   Return in about 1 week (around 08/21/2020) for ROB, in person.   Mariel Aloe, MD Faculty Attending Center for [facility]

## 2020-08-15 LAB — GC/CHLAMYDIA PROBE AMP (~~LOC~~) NOT AT ARMC
Chlamydia: NEGATIVE
Comment: NEGATIVE
Comment: NORMAL
Neisseria Gonorrhea: NEGATIVE

## 2020-08-17 LAB — CULTURE, BETA STREP (GROUP B ONLY): Strep Gp B Culture: POSITIVE — AB

## 2020-08-27 ENCOUNTER — Encounter: Payer: Self-pay | Admitting: Family Medicine

## 2020-08-27 ENCOUNTER — Ambulatory Visit (INDEPENDENT_AMBULATORY_CARE_PROVIDER_SITE_OTHER): Payer: Medicaid Other | Admitting: Family Medicine

## 2020-08-27 ENCOUNTER — Other Ambulatory Visit: Payer: Self-pay

## 2020-08-27 VITALS — BP 108/69 | HR 90 | Wt 242.0 lb

## 2020-08-27 DIAGNOSIS — O99210 Obesity complicating pregnancy, unspecified trimester: Secondary | ICD-10-CM

## 2020-08-27 DIAGNOSIS — Z3480 Encounter for supervision of other normal pregnancy, unspecified trimester: Secondary | ICD-10-CM

## 2020-08-27 NOTE — Patient Instructions (Signed)

## 2020-08-27 NOTE — Progress Notes (Signed)
IOL scheduled for 09/08/20 in the am.  Pt notified that L&D will contact her with the time.  Pt verbalized understanding.   Franz Svec,RN  08/27/20

## 2020-08-27 NOTE — Progress Notes (Signed)
   Subjective:  Tamara Lowery is a 25 y.o. G2P1001 at [redacted]w[redacted]d being seen today for ongoing prenatal care.  She is currently monitored for the following issues for this low-risk pregnancy and has Late prenatal care affecting pregnancy in second trimester; Supervision of other normal pregnancy, antepartum; Chlamydia trachomatis infection in mother during second trimester of pregnancy; Maternal obesity affecting pregnancy, antepartum; and [redacted] weeks gestation of pregnancy on their problem list.  Patient reports no complaints.  Contractions: Irritability. Vag. Bleeding: None.  Movement: Present. Denies leaking of fluid.   The following portions of the patient's history were reviewed and updated as appropriate: allergies, current medications, past family history, past medical history, past social history, past surgical history and problem list. Problem list updated.  Objective:   Vitals:   08/27/20 1527  BP: 108/69  Pulse: 90  Weight: 242 lb (109.8 kg)    Fetal Status: Fetal Heart Rate (bpm): 139   Movement: Present  Presentation: Vertex  General:  Alert, oriented and cooperative. Patient is in no acute distress.  Skin: Skin is warm and dry. No rash noted.   Cardiovascular: Normal heart rate noted  Respiratory: Normal respiratory effort, no problems with respiration noted  Abdomen: Soft, gravid, appropriate for gestational age. Pain/Pressure: Present     Pelvic: Vag. Bleeding: None     Cervical exam performed Dilation: Closed   Station: -3  Extremities: Normal range of motion.  Edema: Trace  Mental Status: Normal mood and affect. Normal behavior. Normal judgment and thought content.   Urinalysis:      Assessment and Plan:  Pregnancy: G2P1001 at [redacted]w[redacted]d  1. Supervision of other normal pregnancy, antepartum BP and FHR normal Cervix still closed but soft and feels very effaced, cephalic presentation Declined membrane sweep Discussed IOL, scheduled for 41 wks and orders  placed  2. Maternal obesity affecting pregnancy, antepartum   Term labor symptoms and general obstetric precautions including but not limited to vaginal bleeding, contractions, leaking of fluid and fetal movement were reviewed in detail with the patient. Please refer to After Visit Summary for other counseling recommendations.  Return in 1 week (on 09/03/2020) for [facility], ob visit.   Venora Maples, MD

## 2020-09-02 ENCOUNTER — Encounter (HOSPITAL_COMMUNITY): Payer: Self-pay | Admitting: *Deleted

## 2020-09-02 ENCOUNTER — Encounter (HOSPITAL_COMMUNITY): Payer: Self-pay | Admitting: Obstetrics and Gynecology

## 2020-09-02 ENCOUNTER — Telehealth (HOSPITAL_COMMUNITY): Payer: Self-pay | Admitting: *Deleted

## 2020-09-02 ENCOUNTER — Inpatient Hospital Stay (HOSPITAL_COMMUNITY)
Admission: AD | Admit: 2020-09-02 | Discharge: 2020-09-02 | Disposition: A | Discharge status: Home / Self Care | Payer: Medicaid Other | Attending: Obstetrics and Gynecology | Admitting: Obstetrics and Gynecology

## 2020-09-02 ENCOUNTER — Encounter (HOSPITAL_COMMUNITY): Payer: Self-pay

## 2020-09-02 DIAGNOSIS — O479 False labor, unspecified: Secondary | ICD-10-CM

## 2020-09-02 DIAGNOSIS — O471 False labor at or after 37 completed weeks of gestation: Secondary | ICD-10-CM | POA: Insufficient documentation

## 2020-09-02 DIAGNOSIS — Z3A41 41 weeks gestation of pregnancy: Secondary | ICD-10-CM | POA: Insufficient documentation

## 2020-09-02 DIAGNOSIS — Z3A40 40 weeks gestation of pregnancy: Secondary | ICD-10-CM

## 2020-09-02 NOTE — Telephone Encounter (Signed)
Preadmission screen  

## 2020-09-02 NOTE — MAU Note (Signed)
I have communicated with Raelyn Mora CNM and reviewed vital signs:  Vitals:   09/02/20 2227  BP: (!) 121/58  Pulse: 97  Resp: 20  Temp: 97.7 F (36.5 C)    Vaginal exam:  Dilation: Closed Cervical Position:  (Left) Exam by:: Ginnie Smart RN,   Also reviewed contraction pattern and that non-stress test is reactive.  It has been documented that patient is not contracting with  no cervical change since her office visit not indicating active labor.  Patient denies any other complaints.  Based on this report provider has given order for discharge.  A discharge order and diagnosis entered by a provider.   Labor discharge instructions reviewed with patient.

## 2020-09-02 NOTE — MAU Note (Signed)
PT SAYS SHE HAD SEX AT 9PM- THEN SAW BLOOD IN TOILET .  IN TRIAGE - NO BLEEDING  FEELS MORE PRESSURE THAN BEFORE SINCE SEX. PNC WITH CLINIC  VE- CLOSED  GBS- POSITIVE DENIES HSV NO UC'S.

## 2020-09-03 ENCOUNTER — Encounter: Payer: Self-pay | Admitting: *Deleted

## 2020-09-04 ENCOUNTER — Ambulatory Visit (INDEPENDENT_AMBULATORY_CARE_PROVIDER_SITE_OTHER): Payer: Medicaid Other | Admitting: Family Medicine

## 2020-09-04 ENCOUNTER — Other Ambulatory Visit: Payer: Self-pay

## 2020-09-04 VITALS — BP 107/72 | HR 106 | Wt 240.3 lb

## 2020-09-04 DIAGNOSIS — O0932 Supervision of pregnancy with insufficient antenatal care, second trimester: Secondary | ICD-10-CM

## 2020-09-04 DIAGNOSIS — Z3480 Encounter for supervision of other normal pregnancy, unspecified trimester: Secondary | ICD-10-CM

## 2020-09-04 DIAGNOSIS — O99210 Obesity complicating pregnancy, unspecified trimester: Secondary | ICD-10-CM

## 2020-09-04 NOTE — Progress Notes (Signed)
   Subjective:  Tamara Lowery is a 25 y.o. G2P1001 at [redacted]w[redacted]d being seen today for ongoing prenatal care.  She is currently monitored for the following issues for this low-risk pregnancy and has Late prenatal care affecting pregnancy in second trimester; Supervision of other normal pregnancy, antepartum; Chlamydia trachomatis infection in mother during second trimester of pregnancy; Maternal obesity affecting pregnancy, antepartum; and [redacted] weeks gestation of pregnancy on their problem list.  Patient reports no complaints.  Contractions: Irritability. Vag. Bleeding: None.  Movement: Present. Denies leaking of fluid.   The following portions of the patient's history were reviewed and updated as appropriate: allergies, current medications, past family history, past medical history, past social history, past surgical history and problem list. Problem list updated.  Objective:   Vitals:   09/04/20 1623  BP: 107/72  Pulse: (!) 106  Weight: 240 lb 4.8 oz (109 kg)    Fetal Status: Fetal Heart Rate (bpm): 148   Movement: Present  Presentation: Vertex  General:  Alert, oriented and cooperative. Patient is in no acute distress.  Skin: Skin is warm and dry. No rash noted.   Cardiovascular: Normal heart rate noted  Respiratory: Normal respiratory effort, no problems with respiration noted  Abdomen: Soft, gravid, appropriate for gestational age. Pain/Pressure: Present     Pelvic: Vag. Bleeding: None     Cervical exam performed Dilation: Closed      Extremities: Normal range of motion.  Edema: Trace  Mental Status: Normal mood and affect. Normal behavior. Normal judgment and thought content.   Urinalysis:      Assessment and Plan:  Pregnancy: G2P1001 at [redacted]w[redacted]d  1. Supervision of other normal pregnancy, antepartum BP and FHR normal Discussed labor precautions Cervix checked by Terri, still closed Seen in MAU two days prior at [redacted]w[redacted]d for labor check, reactive NST at that time, sufficient  for post dates testing  2. Late prenatal care affecting pregnancy in second trimester   3. Maternal obesity affecting pregnancy, antepartum   Term labor symptoms and general obstetric precautions including but not limited to vaginal bleeding, contractions, leaking of fluid and fetal movement were reviewed in detail with the patient. Please refer to After Visit Summary for other counseling recommendations.  Return in 6 weeks (on 10/16/2020) for PP check.   Venora Maples, MD

## 2020-09-04 NOTE — Patient Instructions (Signed)

## 2020-09-05 DIAGNOSIS — B951 Streptococcus, group B, as the cause of diseases classified elsewhere: Secondary | ICD-10-CM | POA: Insufficient documentation

## 2020-09-06 ENCOUNTER — Other Ambulatory Visit: Payer: Self-pay | Admitting: Family Medicine

## 2020-09-07 ENCOUNTER — Other Ambulatory Visit: Payer: Self-pay | Admitting: Family Medicine

## 2020-09-07 ENCOUNTER — Emergency Department (HOSPITAL_COMMUNITY)
Admission: EM | Admit: 2020-09-07 | Discharge: 2020-09-08 | Disposition: A | Discharge status: Home / Self Care | Payer: Medicaid Other | Source: Home / Self Care | Attending: Emergency Medicine | Admitting: Emergency Medicine

## 2020-09-07 ENCOUNTER — Encounter (HOSPITAL_COMMUNITY): Payer: Self-pay | Admitting: Emergency Medicine

## 2020-09-07 DIAGNOSIS — S91201A Unspecified open wound of right great toe with damage to nail, initial encounter: Secondary | ICD-10-CM | POA: Insufficient documentation

## 2020-09-07 DIAGNOSIS — W228XXA Striking against or struck by other objects, initial encounter: Secondary | ICD-10-CM | POA: Insufficient documentation

## 2020-09-07 DIAGNOSIS — Z87891 Personal history of nicotine dependence: Secondary | ICD-10-CM | POA: Insufficient documentation

## 2020-09-07 DIAGNOSIS — Z3A21 21 weeks gestation of pregnancy: Secondary | ICD-10-CM | POA: Insufficient documentation

## 2020-09-07 DIAGNOSIS — Y9389 Activity, other specified: Secondary | ICD-10-CM | POA: Insufficient documentation

## 2020-09-07 DIAGNOSIS — O9A212 Injury, poisoning and certain other consequences of external causes complicating pregnancy, second trimester: Secondary | ICD-10-CM | POA: Insufficient documentation

## 2020-09-07 DIAGNOSIS — S91209A Unspecified open wound of unspecified toe(s) with damage to nail, initial encounter: Secondary | ICD-10-CM

## 2020-09-07 DIAGNOSIS — E119 Type 2 diabetes mellitus without complications: Secondary | ICD-10-CM | POA: Insufficient documentation

## 2020-09-07 LAB — SARS CORONAVIRUS 2 (TAT 6-24 HRS): SARS Coronavirus 2: NEGATIVE

## 2020-09-07 MED ORDER — ACETAMINOPHEN 325 MG PO TABS: 650.0000 mg | ORAL_TABLET | Freq: Once | ORAL | Status: DC

## 2020-09-07 NOTE — ED Triage Notes (Signed)
Pt here with c/o her toe nail on big toe on her right foot almost off , sticking up in the air

## 2020-09-08 ENCOUNTER — Inpatient Hospital Stay (HOSPITAL_COMMUNITY): Payer: Medicaid Other

## 2020-09-08 ENCOUNTER — Encounter (HOSPITAL_COMMUNITY): Payer: Self-pay | Admitting: Family Medicine

## 2020-09-08 ENCOUNTER — Inpatient Hospital Stay (HOSPITAL_COMMUNITY): Payer: Medicaid Other | Admitting: Anesthesiology

## 2020-09-08 ENCOUNTER — Inpatient Hospital Stay (HOSPITAL_COMMUNITY)
Admission: AD | Admit: 2020-09-08 | Discharge: 2020-09-10 | DRG: 807 | Disposition: A | Discharge status: Home / Self Care | Payer: Medicaid Other | Attending: Family Medicine | Admitting: Family Medicine

## 2020-09-08 DIAGNOSIS — Z3A41 41 weeks gestation of pregnancy: Secondary | ICD-10-CM | POA: Diagnosis not present

## 2020-09-08 DIAGNOSIS — Z3480 Encounter for supervision of other normal pregnancy, unspecified trimester: Secondary | ICD-10-CM

## 2020-09-08 DIAGNOSIS — O480 Post-term pregnancy: Principal | ICD-10-CM | POA: Diagnosis present

## 2020-09-08 DIAGNOSIS — S91202D Unspecified open wound of left great toe with damage to nail, subsequent encounter: Secondary | ICD-10-CM

## 2020-09-08 DIAGNOSIS — A568 Sexually transmitted chlamydial infection of other sites: Secondary | ICD-10-CM | POA: Diagnosis present

## 2020-09-08 DIAGNOSIS — Z87891 Personal history of nicotine dependence: Secondary | ICD-10-CM

## 2020-09-08 DIAGNOSIS — O99824 Streptococcus B carrier state complicating childbirth: Secondary | ICD-10-CM | POA: Diagnosis present

## 2020-09-08 DIAGNOSIS — T1490XA Injury, unspecified, initial encounter: Principal | ICD-10-CM

## 2020-09-08 DIAGNOSIS — O99892 Other specified diseases and conditions complicating childbirth: Secondary | ICD-10-CM | POA: Diagnosis present

## 2020-09-08 LAB — CBC
HCT: 37.9 % (ref 36.0–46.0)
Hemoglobin: 12.6 g/dL (ref 12.0–15.0)
MCH: 31.5 pg (ref 26.0–34.0)
MCHC: 33.2 g/dL (ref 30.0–36.0)
MCV: 94.8 fL (ref 80.0–100.0)
Platelets: 308 10*3/uL (ref 150–400)
RBC: 4 MIL/uL (ref 3.87–5.11)
RDW: 13.3 % (ref 11.5–15.5)
WBC: 10.3 10*3/uL (ref 4.0–10.5)
nRBC: 0 % (ref 0.0–0.2)

## 2020-09-08 LAB — TYPE AND SCREEN
ABO/RH(D): A POS
Antibody Screen: NEGATIVE

## 2020-09-08 MED ORDER — MISOPROSTOL 50MCG HALF TABLET
50.0000 ug | ORAL_TABLET | ORAL | Status: DC | PRN
  Administered 2020-09-08 (×2): 50 ug via ORAL
  Filled 2020-09-08 (×2): qty 1

## 2020-09-08 MED ORDER — EPHEDRINE 5 MG/ML INJ
10.0000 mg | INTRAVENOUS | Status: DC | PRN
Start: 2020-09-08 — End: 2020-09-09

## 2020-09-08 MED ORDER — PHENYLEPHRINE 40 MCG/ML (10ML) SYRINGE FOR IV PUSH (FOR BLOOD PRESSURE SUPPORT): 80.0000 ug | PREFILLED_SYRINGE | INTRAVENOUS | Status: DC | PRN

## 2020-09-08 MED ORDER — LACTATED RINGERS IV SOLN: INTRAVENOUS | Status: DC

## 2020-09-08 MED ORDER — DIPHENHYDRAMINE HCL 50 MG/ML IJ SOLN: 12.5000 mg | INTRAMUSCULAR | Status: DC | PRN

## 2020-09-08 MED ORDER — EPHEDRINE 5 MG/ML INJ: 10.0000 mg | INTRAVENOUS | Status: DC | PRN

## 2020-09-08 MED ORDER — LIDOCAINE HCL (PF) 1 % IJ SOLN
10.0000 mL | Freq: Once | INTRAMUSCULAR | Status: AC
  Administered 2020-09-08: 10 mL
  Filled 2020-09-08: qty 10

## 2020-09-08 MED ORDER — TERBUTALINE SULFATE 1 MG/ML IJ SOLN: 0.2500 mg | Freq: Once | INTRAMUSCULAR | Status: DC | PRN

## 2020-09-08 MED ORDER — ACETAMINOPHEN 325 MG PO TABS
650.0000 mg | ORAL_TABLET | ORAL | Status: DC | PRN
  Administered 2020-09-08: 650 mg via ORAL
  Filled 2020-09-08: qty 2

## 2020-09-08 MED ORDER — PENICILLIN G POT IN DEXTROSE 60000 UNIT/ML IV SOLN
3.0000 10*6.[IU] | INTRAVENOUS | Status: DC
  Administered 2020-09-08 (×3): 3 10*6.[IU] via INTRAVENOUS
  Filled 2020-09-08 (×3): qty 50

## 2020-09-08 MED ORDER — LIDOCAINE HCL (PF) 1 % IJ SOLN: 30.0000 mL | INTRAMUSCULAR | Status: DC | PRN

## 2020-09-08 MED ORDER — OXYTOCIN-SODIUM CHLORIDE 30-0.9 UT/500ML-% IV SOLN
1.0000 m[IU]/min | INTRAVENOUS | Status: DC
  Administered 2020-09-08: 2 m[IU]/min via INTRAVENOUS
  Filled 2020-09-08: qty 500

## 2020-09-08 MED ORDER — OXYTOCIN BOLUS FROM INFUSION
333.0000 mL | Freq: Once | INTRAVENOUS | Status: AC
  Administered 2020-09-09: 333 mL via INTRAVENOUS

## 2020-09-08 MED ORDER — SOD CITRATE-CITRIC ACID 500-334 MG/5ML PO SOLN: 30.0000 mL | ORAL | Status: DC | PRN

## 2020-09-08 MED ORDER — SODIUM CHLORIDE 0.9 % IV SOLN
5.0000 10*6.[IU] | Freq: Once | INTRAVENOUS | Status: AC
  Administered 2020-09-08: 5 10*6.[IU] via INTRAVENOUS
  Filled 2020-09-08: qty 5

## 2020-09-08 MED ORDER — LACTATED RINGERS IV SOLN: 500.0000 mL | INTRAVENOUS | Status: DC | PRN

## 2020-09-08 MED ORDER — ONDANSETRON HCL 4 MG/2ML IJ SOLN: 4.0000 mg | Freq: Four times a day (QID) | INTRAMUSCULAR | Status: DC | PRN

## 2020-09-08 MED ORDER — OXYTOCIN-SODIUM CHLORIDE 30-0.9 UT/500ML-% IV SOLN: 2.5000 [IU]/h | INTRAVENOUS | Status: DC

## 2020-09-08 MED ORDER — FENTANYL CITRATE (PF) 100 MCG/2ML IJ SOLN
50.0000 ug | INTRAMUSCULAR | Status: DC | PRN
  Administered 2020-09-08: 50 ug via INTRAVENOUS
  Administered 2020-09-08 – 2020-09-09 (×2): 100 ug via INTRAVENOUS
  Filled 2020-09-08 (×4): qty 2

## 2020-09-08 MED ORDER — LACTATED RINGERS IV SOLN: 500.0000 mL | Freq: Once | INTRAVENOUS | Status: DC

## 2020-09-08 MED ORDER — FENTANYL-BUPIVACAINE-NACL 0.5-0.125-0.9 MG/250ML-% EP SOLN
12.0000 mL/h | EPIDURAL | Status: DC | PRN
  Filled 2020-09-08: qty 250

## 2020-09-08 NOTE — ED Provider Notes (Addendum)
[facility] EMERGENCY DEPARTMENT Provider Note   CSN: [id number] Arrival date & time: 09/07/20  2127     History No chief complaint on file.   Tamara Lowery is a 25 y.o. female.  Tamara Lowery is a 25 y.o. female with a history of diabetes, currently [redacted] weeks pregnant scheduled for induction in the morning, who presents to the ED due to toenail injury on her right big toe.  She reports just prior to arrival her family member was moving furniture and pushed the piece of furniture into her toe pulling up her big toenail.  She reports its been sticking up and she was not able to move it back into place due to pain.  She has had some bleeding.  No numbness or tingling.  No other aggravating or alleviating factors.  The history is provided by the patient.      Past Medical History:  Diagnosis Date   Diabetes mellitus without complication (HCC)    Excessive weight gain 07/14/2017   50 pounds at 28 weeks   GDM (gestational diabetes mellitus), class A1 08/19/2017   Current Diabetic Medications:  None  [ ]  Aspirin 81 mg daily after 12 weeks (? A2/B GDM)  Required Referrals for A1GDM or A2GDM: [X]  Diabetes Education and Testing Supplies [X]  Nutrition Cousult  For A2/B GDM or higher classes of DM [ ]  Diabetes Education and Testing Supplies [ ]  Nutrition Counsult [ ]  Fetal ECHO after 20 weeks  [ ]  Eye exam for retina evaluation   Baseline and surveillance labs (   Gestational diabetes    Group B Streptococcus carrier, +RV culture, currently pregnant 09/08/2017   Needs treatment in labor   History of gestational diabetes mellitus (GDM) 03/12/2020   Labor and delivery indication for care or intervention 10/02/2017   Low blood hemoglobin A2 03/24/2017   Discussed with patient. Declines genetic testing   Supervision of high risk pregnancy, antepartum 03/12/2017     Clinic  [facility] Prenatal Labs Dating  LMP Blood type:   A pos Genetic Screen 1 Screen:  normal   AFP:     Quad:     NIPS: Antibody:  neg Anatomic  normal Rubella:  immune GTT Early:               Third trimester: 93/106/127 RPR:   NR Flu vaccine  declines HBsAg:   neg TDaP vaccine  07/14/17                       Rhogam: NA HIV:  NR Baby Food  breastfeed                                             Trichomonal infection     Patient Active Problem List   Diagnosis Date Noted   Group beta Strep positive 09/05/2020   [redacted] weeks gestation of pregnancy 08/14/2020   Maternal obesity affecting pregnancy, antepartum 05/09/2020   Chlamydia trachomatis infection in mother during second trimester of pregnancy 03/13/2020   Late prenatal care affecting pregnancy in second trimester 03/12/2020   Supervision of other normal pregnancy, antepartum 03/12/2020    Past Surgical History:  Procedure Laterality Date   NO PAST SURGERIES       OB History     Gravida  2   Para  1   Term  1   Preterm      AB      Living  1      SAB      IAB      Ectopic      Multiple      Live Births  1           Family History  Problem Relation Age of Onset   Cancer Neg Hx    Diabetes Neg Hx    Heart disease Neg Hx    Hypertension Neg Hx    Obesity Neg Hx     Social History   Tobacco Use   Smoking status: Former    Packs/day: 0.02    Types: Cigarettes   Smokeless tobacco: Never  Vaping Use   Vaping Use: Never used  Substance Use Topics   Alcohol use: No   Drug use: Yes    Types: Marijuana    Comment: stopped when found out she was pregnant in December     Home Medications Prior to Admission medications   Medication Sig Start Date End Date Taking? Authorizing Provider  ferrous sulfate (FERROUSUL) 325 (65 FE) MG tablet Take 1 tablet (325 mg total) by mouth every other day for 30 doses. 06/11/20 08/27/20  Canova Bing, MD  pantoprazole (PROTONIX) 20 MG tablet Take 1 tablet (20 mg total) by mouth 2 (two) times daily. 06/06/20   [place] Bing, MD  Prenatal Vit-Fe  Fumarate-FA (PREPLUS) 27-1 MG TABS Take 1 tablet by mouth daily. 06/06/20   [place] Bing, MD    Allergies    Patient has no known allergies.  Review of Systems   Review of Systems  Constitutional:  Negative for chills and fever.  Musculoskeletal:  Positive for arthralgias.  Skin:  Positive for wound. Negative for color change and rash.  Neurological:  Negative for weakness and numbness.   Physical Exam Updated Vital Signs BP 124/81 (BP Location: Right Arm)   Pulse 94   Temp 98.2 F (36.8 C) (Oral)   Resp 18   LMP 10/27/2019   SpO2 99%   Physical Exam Vitals and nursing note reviewed.  Constitutional:      General: She is not in acute distress.    Appearance: Normal appearance. She is well-developed. She is not ill-appearing or diaphoretic.  HENT:     Head: Normocephalic and atraumatic.  Eyes:     General:        Right eye: No discharge.        Left eye: No discharge.  Pulmonary:     Effort: Pulmonary effort is normal. No respiratory distress.  Abdominal:     Comments: Gravid abdomen  Musculoskeletal:     Comments: Right great toenail with partial avulsion, small amount of bleeding, on the lateral portion of the nail it is completely removed from the nail fold, still intact along the medial corner.  No deformity to the rest of the toe.  Normal cap refill, range of motion and sensation.  Neurological:     Mental Status: She is alert and oriented to person, place, and time.     Coordination: Coordination normal.  Psychiatric:        Mood and Affect: Mood normal.        Behavior: Behavior normal.    ED Results / Procedures / Treatments   Labs (all labs ordered are listed, but only abnormal results are displayed) Labs Reviewed - No data to display  EKG None  Radiology No results found.  Procedures .Nerve Block  Date/Time: 09/08/2020 1:37 AM Performed by: Dartha Lodge, PA-C Authorized by: Dartha Lodge, PA-C   Consent:    Consent obtained:  Verbal    Consent given by:  Patient   Risks, benefits, and alternatives were discussed: yes     Risks discussed:  Allergic reaction, infection, nerve damage, bleeding and intravenous injection   Alternatives discussed:  No treatment Universal protocol:    Procedure explained and questions answered to patient or proxy's satisfaction: yes     Patient identity confirmed:  Verbally with patient Indications:    Indications:  Procedural anesthesia Location:    Body area:  Lower extremity   Lower extremity nerve blocked: Gre.at toe digital block.   Laterality:  Right Pre-procedure details:    Skin preparation:  Alcohol Procedure details:    Block needle gauge:  25 G   Anesthetic injected:  Lidocaine 1% w/o epi   Steroid injected:  None   Injection procedure:  Anatomic landmarks identified, incremental injection and negative aspiration for blood Post-procedure details:    Outcome:  Anesthesia achieved   Procedure completion:  Tolerated well, no immediate complications Comments:     Digital block to right great toe for attempted manipulation a partial nail avulsion, attempted to manipulate nail back into nail fold, partially successful, likely some loose toenail, dressing applied.  Patient tolerated well.   Medications Ordered in ED Medications  lidocaine (PF) (XYLOCAINE) 1 % injection 10 mL (has no administration in time range)    ED Course  I have reviewed the triage vital signs and the nursing notes.  Pertinent labs & imaging results that were available during my care of the patient were reviewed by me and considered in my medical decision making (see chart for details).    MDM Rules/Calculators/A&P                           Patient with partial avulsion of the right great toenail.  No bony tenderness or other deformity to the toe, neurovascularly intact.  The area was cleaned, anesthetized with digital block.  Attempted to manipulate nail back down into the nail fold, partially successful  but the lateral portion would not move completely back into the nail fold.  Nail position back into place as best as possible and nonadherent dressing applied and secured with Coban.  Discussed with patient that nail will very likely follow-up regardless.  Discussed appropriate wound care and return precautions.  Patient expresses understanding discharged home in good condition.   Final Clinical Impression(s) / ED Diagnoses Final diagnoses:  Nail avulsion of toe, initial encounter    Rx / DC Orders ED Discharge Orders     None        Legrand Rams 09/08/20 0140    Zadie Rhine, MD 09/08/20 0201    Dartha Lodge, PA-C 09/13/20 1747    Zadie Rhine, MD 09/13/20 2308

## 2020-09-08 NOTE — Progress Notes (Signed)
Labor Progress Note [name] is a 25 y.o. G2P1001 at [redacted]w[redacted]d presented for IOL-PD S: Patient is resting comfortably.   O:  BP (!) 121/58   Pulse 74   Temp 97.7 F (36.5 C) (Oral)   Resp 17   Ht 5\' 7"  (1.702 m)   Wt 77.7 kg   LMP 10/27/2019   BMI 26.81 kg/m  EFM: Baseline 130 BPM/+accels/-decels/mod variability Toco: q4-81min   CVE: Dilation: 5 Effacement (%): 60 Cervical Position: Posterior Station: -3 Presentation: Vertex Exam by:: [email or web address], CNM   A&P: 25 y.o. G2P1001 [redacted]w[redacted]d IOL-PD #Labor: S/P cytotec x2 and FB. AROM for clear fluid@1950 . Will begin pitocin 2/2 and titrate.   #Pain: PRN #FWB: cat 1 #GBS positive>PCN   [redacted]w[redacted]d, MD [facility], [facility] 7:59 PM

## 2020-09-08 NOTE — Progress Notes (Signed)
Labor Progress Note Tamara Lowery is a 25 y.o. G2P1001 at [redacted]w[redacted]d presented for IOL-PD S: Patient is resting comfortably, not really feeling contractions.  O:  BP 124/60   Pulse 73   Temp 97.7 F (36.5 C) (Oral)   Resp 16   Ht 5\' 7"  (1.702 m)   Wt 77.7 kg   LMP 10/27/2019   BMI 26.81 kg/m  EFM: Baseline 140 BPM/+accels/-decels/mod variability Toco: q4-51min   CVE: Dilation: 1.5 Effacement (%): Thick Cervical Position: Posterior Station: Ballotable Presentation: Vertex Exam by:: Adalie Mand   A&P: 25 y.o. G2P1001 [redacted]w[redacted]d IOL-PD #Labor: S/P cytotec x2 and FB inserted. Will monitor and continue with possible AROM/pit pt if indicated.  #Pain: PRN #FWB: cat 1 #GBS positive>PCN   [redacted]w[redacted]d, MD [facility], [facility] 4:37 PM

## 2020-09-08 NOTE — Progress Notes (Signed)
Tamara Lowery is a 25 y.o. G2P1001 at [redacted]w[redacted]d admitted for induction of labor due to Post dates  Subjective: Feeling well. Reports feels her contractions.  Objective: BP 119/61 (BP Location: Right Arm)   Pulse 79   Temp 97.7 F (36.5 C) (Oral)   Resp 18   Ht 5\' 7"  (1.702 m)   Wt 77.7 kg   LMP 10/27/2019   BMI 26.81 kg/m  No intake/output data recorded. No intake/output data recorded.  FHT:  FHR: 140 bpm, variability: moderate,  accelerations:  Present,  decelerations:  Absent UC:   regular, every 2-5 minutes SVE:   Dilation: 5 Effacement (%): 60 Station: -3 Exam by:: [email or web address], CNM  Labs: Lab Results  Component Value Date   WBC 10.3 09/08/2020   HGB 12.6 09/08/2020   HCT 37.9 09/08/2020   MCV 94.8 09/08/2020   PLT 308 09/08/2020    Assessment / Plan: Induction of labor due to post dates,  progressing well on pitocin  Labor: Progressing on Pitocin, will continue to increase  Fetal Wellbeing:  Category I Pain Control:   Planning epidural I/D:   GBS positive, PCN Anticipated MOD:  NSVD  09/10/2020 09/08/2020, 9:48 PM

## 2020-09-08 NOTE — H&P (Addendum)
OBSTETRIC ADMISSION HISTORY AND PHYSICAL  Elwanda Alberts is a 25 y.o. female G2P1001 with IUP at [redacted]w[redacted]d by [redacted]w[redacted]d U/S presenting for IOL-PD. She reports +FMs, No LOF, no VB, no blurry vision, headaches or peripheral edema, and RUQ pain.  She plans on breast feeding. She declines birth control. She received her prenatal care at [facility]   Dating: By 16wk4d U/S --->  Estimated Date of Delivery: 09/01/20  Sono:   05/22/20@[redacted]w[redacted]d , CWD, normal anatomy, breech presentation, fundal placental lie, 808g, 39% EFW  Prenatal History/Complications:  Low hgb A2 Hx of GDM  Past Medical History: Past Medical History:  Diagnosis Date   Excessive weight gain 07/14/2017   50 pounds at [redacted] weeks   Gestational diabetes    Group B Streptococcus carrier, +RV culture, currently pregnant 09/08/2017   Needs treatment in labor   History of gestational diabetes mellitus (GDM) 03/12/2020   Labor and delivery indication for care or intervention 10/02/2017   Low blood hemoglobin A2 03/24/2017   Discussed with patient. Declines genetic testing   Supervision of high risk pregnancy, antepartum 03/12/2017     Clinic  [facility] Prenatal Labs Dating  LMP Blood type:   A pos Genetic Screen 1 Screen: normal   AFP:     Quad:     NIPS: Antibody:  neg Anatomic [place]  normal Rubella:  immune GTT Early:               Third trimester: 93/106/127 RPR:   NR Flu vaccine  declines HBsAg:   neg TDaP vaccine  07/14/17                       Rhogam: NA HIV:  NR Baby Food  breastfeed                                             Trichomonal infection     Past Surgical History: Past Surgical History:  Procedure Laterality Date   NO PAST SURGERIES      Obstetrical History: OB History     Gravida  2   Para  1   Term  1   Preterm      AB      Living  1      SAB      IAB      Ectopic      Multiple      Live Births  1           Social History Social History   Socioeconomic History   Marital status: Single     Spouse name: Cassius   Number of children: 1   Years of education: Not on file   Highest education level: Not on file  Occupational History   Not on file  Tobacco Use   Smoking status: Former    Packs/day: 0.02    Types: Cigarettes   Smokeless tobacco: Never  Vaping Use   Vaping Use: Never used  Substance and Sexual Activity   Alcohol use: No   Drug use: Yes    Types: Marijuana    Comment: stopped when found out she was pregnant in December    Sexual activity: Not Currently    Birth control/protection: None  Other Topics Concern   Not on file  Social History Narrative   Not on file   Social Determinants of  Health   Financial Resource Strain: Not on file  Food Insecurity: No Food Insecurity   Worried About Running Out of Food in the Last Year: Never true   Ran Out of Food in the Last Year: Never true  Transportation Needs: No Transportation Needs   Lack of Transportation (Medical): No   Lack of Transportation (Non-Medical): No  Physical Activity: Not on file  Stress: Not on file  Social Connections: Not on file    Family History: Family History  Problem Relation Age of Onset   Cancer Neg Hx    Diabetes Neg Hx    Heart disease Neg Hx    Hypertension Neg Hx    Obesity Neg Hx     Allergies: No Known Allergies  Medications Prior to Admission  Medication Sig Dispense Refill Last Dose   Prenatal Vit-Fe Fumarate-FA (PREPLUS) 27-1 MG TABS Take 1 tablet by mouth daily. 60 tablet 1 09/07/2020   ferrous sulfate (FERROUSUL) 325 (65 FE) MG tablet Take 1 tablet (325 mg total) by mouth every other day for 30 doses. 30 tablet 0    pantoprazole (PROTONIX) 20 MG tablet Take 1 tablet (20 mg total) by mouth 2 (two) times daily. 60 tablet 0 Unknown     Review of Systems   All systems reviewed and negative except as stated in HPI  Blood pressure 115/67, pulse (!) 101, temperature (!) 97.5 F (36.4 C), temperature source Oral, resp. rate 17, height 5\' 7"  (1.702 m), weight 77.7  kg, last menstrual period 10/27/2019, unknown if currently breastfeeding. General appearance: alert and appears stated age Lungs: clear to auscultation bilaterally Heart: regular rate and rhythm Abdomen: soft, non-tender; bowel sounds normal Pelvic: As stated below  Extremities: Homans sign is negative, no sign of DVT Presentation: cephalic Fetal monitoringBaseline: 140 bpm, Variability: Good {> 6 bpm), Accelerations: Reactive, and Decelerations: Absent Uterine activity: Not tracing well, q5min  Dilation: 1 Effacement (%): Thick Station: Ballotable Exam by:: [email or web address]  Prenatal labs: ABO, Rh: --/--/A POS (08/14 1051) Antibody: NEG (08/14 1051) Rubella: 3.23 (02/24 1142) RPR: Non Reactive (05/12 0921)  HBsAg: Negative (02/24 1142)  HIV: Non Reactive (05/12 0921)  GBS: Positive/-- (07/20 1555)  2 hr Glucola passed Genetic screening  Low risk female; Horizon neg; AFP neg Anatomy 08-06-2005 NML  Prenatal Transfer Tool  Maternal Diabetes: No Genetic Screening: Normal Maternal Ultrasounds/Referrals: Normal Fetal Ultrasounds or other Referrals:  None Maternal Substance Abuse:  No Significant Maternal Medications:  None Significant Maternal Lab Results: Group B Strep positive  Results for orders placed or performed during the hospital encounter of 09/08/20 (from the past 24 hour(s))  CBC   Collection Time: 09/08/20 10:30 AM  Result Value Ref Range   WBC 10.3 4.0 - 10.5 K/uL   RBC 4.00 3.87 - 5.11 MIL/uL   Hemoglobin 12.6 12.0 - 15.0 g/dL   HCT 09/10/20 26.8 - 34.1 %   MCV 94.8 80.0 - 100.0 fL   MCH 31.5 26.0 - 34.0 pg   MCHC 33.2 30.0 - 36.0 g/dL   RDW 96.2 22.9 - 79.8 %   Platelets 308 150 - 400 K/uL   nRBC 0.0 0.0 - 0.2 %  Type and screen   Collection Time: 09/08/20 10:51 AM  Result Value Ref Range   ABO/RH(D) A POS    Antibody Screen NEG    Sample Expiration      09/11/2020,2359 Performed at [facility], [address], [place], [place]     Patient  Active Problem List   Diagnosis Date Noted   Post-dates pregnancy 09/08/2020   Group beta Strep positive 09/05/2020   [redacted] weeks gestation of pregnancy 08/14/2020   Maternal obesity affecting pregnancy, antepartum 05/09/2020   Chlamydia trachomatis infection in mother during second trimester of pregnancy 03/13/2020   Late prenatal care affecting pregnancy in second trimester 03/12/2020   Supervision of other normal pregnancy, antepartum 03/12/2020    Assessment/Plan:  Perla Palacios-Tapia is a 25 y.o. G2P1001 at [redacted]w[redacted]d here for IOL-PD  #Labor: Will continue with cytotec x1 and likely place FB on next check if indicated.  #Pain: PRN #FWB: Cat 1 #ID: GBS +>PCN #MOF: br&bottle #MOC: declined #Nail avulsion of the great toe: She had been seen in the ED over night prior to induction for this problem. We will start with tylenol and ice to alleviate the pain. Area was secured with coban at ED and dressing looks clean, dry, and intact.    Alfredo Martinez, MD  09/08/2020, 12:56 PM

## 2020-09-08 NOTE — Discharge Instructions (Addendum)
Toenail will likely fall off, keep clean, change dressing as needed.  Monitor for redness, swelling, increasing pain or drainage as these are signs of infections.

## 2020-09-09 ENCOUNTER — Inpatient Hospital Stay (HOSPITAL_COMMUNITY): Payer: Medicaid Other

## 2020-09-09 DIAGNOSIS — Z3A41 41 weeks gestation of pregnancy: Secondary | ICD-10-CM

## 2020-09-09 DIAGNOSIS — O99824 Streptococcus B carrier state complicating childbirth: Secondary | ICD-10-CM

## 2020-09-09 DIAGNOSIS — O480 Post-term pregnancy: Secondary | ICD-10-CM

## 2020-09-09 LAB — RPR: RPR Ser Ql: NONREACTIVE

## 2020-09-09 MED ORDER — DIPHENHYDRAMINE HCL 25 MG PO CAPS: 25.0000 mg | ORAL_CAPSULE | Freq: Four times a day (QID) | ORAL | Status: DC | PRN

## 2020-09-09 MED ORDER — MEASLES, MUMPS & RUBELLA VAC IJ SOLR: 0.5000 mL | Freq: Once | INTRAMUSCULAR | Status: DC

## 2020-09-09 MED ORDER — ONDANSETRON HCL 4 MG PO TABS: 4.0000 mg | ORAL_TABLET | ORAL | Status: DC | PRN

## 2020-09-09 MED ORDER — ACETAMINOPHEN 325 MG PO TABS
650.0000 mg | ORAL_TABLET | ORAL | Status: DC | PRN
  Administered 2020-09-09 (×2): 650 mg via ORAL
  Filled 2020-09-09 (×2): qty 2

## 2020-09-09 MED ORDER — MEDROXYPROGESTERONE ACETATE 150 MG/ML IM SUSP: 150.0000 mg | INTRAMUSCULAR | Status: DC | PRN

## 2020-09-09 MED ORDER — ONDANSETRON HCL 4 MG/2ML IJ SOLN: 4.0000 mg | INTRAMUSCULAR | Status: DC | PRN

## 2020-09-09 MED ORDER — PRENATAL MULTIVITAMIN CH
1.0000 | ORAL_TABLET | Freq: Every day | ORAL | Status: DC
  Administered 2020-09-09 – 2020-09-10 (×2): 1 via ORAL
  Filled 2020-09-09 (×2): qty 1

## 2020-09-09 MED ORDER — COCONUT OIL OIL: 1.0000 "application " | TOPICAL_OIL | Status: DC | PRN

## 2020-09-09 MED ORDER — SENNOSIDES-DOCUSATE SODIUM 8.6-50 MG PO TABS
2.0000 | ORAL_TABLET | Freq: Every day | ORAL | Status: DC
  Administered 2020-09-10: 2 via ORAL
  Filled 2020-09-09: qty 2

## 2020-09-09 MED ORDER — BENZOCAINE-MENTHOL 20-0.5 % EX AERO: 1.0000 "application " | INHALATION_SPRAY | CUTANEOUS | Status: DC | PRN

## 2020-09-09 MED ORDER — DIBUCAINE (PERIANAL) 1 % EX OINT: 1.0000 "application " | TOPICAL_OINTMENT | CUTANEOUS | Status: DC | PRN

## 2020-09-09 MED ORDER — IBUPROFEN 600 MG PO TABS
600.0000 mg | ORAL_TABLET | Freq: Four times a day (QID) | ORAL | Status: DC
  Administered 2020-09-09 – 2020-09-10 (×6): 600 mg via ORAL
  Filled 2020-09-09 (×6): qty 1

## 2020-09-09 MED ORDER — SIMETHICONE 80 MG PO CHEW: 80.0000 mg | CHEWABLE_TABLET | ORAL | Status: DC | PRN

## 2020-09-09 MED ORDER — TETANUS-DIPHTH-ACELL PERTUSSIS 5-2.5-18.5 LF-MCG/0.5 IM SUSY: 0.5000 mL | PREFILLED_SYRINGE | Freq: Once | INTRAMUSCULAR | Status: DC

## 2020-09-09 MED ORDER — WITCH HAZEL-GLYCERIN EX PADS: 1.0000 "application " | MEDICATED_PAD | CUTANEOUS | Status: DC | PRN

## 2020-09-09 NOTE — Lactation Note (Signed)
This note was copied from a baby's chart. Lactation Consultation Note  Patient Name: Girl Ileta Palacios-Tapia Today's Date: 09/09/2020 Reason for consult: Initial assessment Age:24 hours P2, mother attempt to breastfeed first child only in hospital. Mothers goal on admission to breast and bottle feed. Mother has been bottle feeding. She reports that she attempt to breast feed in L&D, but didn't see any colostrum. Assist with Hand express and observed large drops of colostrum.  Assist mother with latching infant.  Mother preferred to latch in cradle hold.  Observed infant with shallow latch and intermittent swallows for 20 mins Nipple pinched when released the breast.  Lots of teaching with mother .  Lactation brochure given.  Encouraged mother to breastfeed first and then supplement if needed.  Father very supportive.   Maternal Data Has patient been taught Hand Expression?: Yes  Feeding Mother's Current Feeding Choice: Breast Milk and Formula  LATCH Score Latch: Repeated attempts needed to sustain latch, nipple held in mouth throughout feeding, stimulation needed to elicit sucking reflex.  Audible Swallowing: Spontaneous and intermittent  Type of Nipple: Everted at rest and after stimulation  Comfort (Breast/Nipple): Soft / non-tender  Hold (Positioning): Assistance needed to correctly position infant at breast and maintain latch.  LATCH Score: 8   Lactation Tools Discussed/Used    Interventions Interventions: Breast feeding basics reviewed;Assisted with latch;Skin to skin;Hand express;Breast compression;Adjust position;Support pillows;Position options;Education  Discharge WIC Program: Yes  Consult Status Consult Status: Follow-up Date: 09/10/20 Follow-up type: In-patient    Stevan Born [facility] 09/09/2020, 1:13 PM

## 2020-09-09 NOTE — Anesthesia Procedure Notes (Deleted)
Epidural Patient location during procedure: OB Start time: 09/09/2020 1:39 AM  Staffing Anesthesiologist: Trevor Iha, MD Performed: anesthesiologist   Preanesthetic Checklist Completed: patient identified, IV checked, site marked, risks and benefits discussed, surgical consent, monitors and equipment checked, pre-op evaluation and timeout performed  Epidural Patient position: sitting Prep: DuraPrep and site prepped and draped Patient monitoring: continuous pulse ox and blood pressure Approach: midline Injection technique: LOR air  Needle:  Needle type: Tuohy  Needle gauge: 17 G Needle length: 9 cm and 9 Needle insertion depth: 5 cm cm Catheter type: closed end flexible Catheter size: 19 Gauge Catheter at skin depth: 10 cm Test dose: negative  Assessment Events: blood not aspirated, injection not painful, no injection resistance, no paresthesia and negative IV test  Additional Notes Patient identified. Risks/Benefits/Options discussed with patient including but not limited to bleeding, infection, nerve damage, paralysis, failed block, incomplete pain control, headache, blood pressure changes, nausea, vomiting, reactions to medication both or allergic, itching and postpartum back pain. Confirmed with bedside nurse the patient's most recent platelet count. Confirmed with patient that they are not currently taking any anticoagulation, have any bleeding history or any family history of bleeding disorders. Patient expressed understanding and wished to proceed. All questions were answered. Sterile technique was used throughout the entire procedure. Please see nursing notes for vital signs. Test dose was given through epidural needle and negative prior to continuing to dose epidural or start infusion. Warning signs of high block given to the patient including shortness of breath, tingling/numbness in hands, complete motor block, or any concerning symptoms with instructions to call for  help. Patient was given instructions on fall risk and not to get out of bed. All questions and concerns addressed with instructions to call with any issues.  Attempt (S) . Patient tolerated procedure well.

## 2020-09-09 NOTE — Discharge Summary (Signed)
Postpartum Discharge Summary  Date of Service updated 09/10/2020      Patient Name: Tamara Lowery DOB: 05-19-1995 MRN: [id number]  Date of admission: 09/08/2020 Delivery date:09/09/2020  Delivering provider: Renard Matter  Date of discharge: 09/10/2020  Admitting diagnosis: Post-dates pregnancy [O48.0] Intrauterine pregnancy: [redacted]w[redacted]d    Secondary diagnosis:  Active Problems:   Supervision of other normal pregnancy, antepartum   Chlamydia trachomatis infection in mother during second trimester of pregnancy   Post-dates pregnancy   Vaginal delivery  Additional problems: None    Discharge diagnosis: Term Pregnancy Delivered                                              Post partum procedures: None Complications: None  Hospital course: Induction of Labor With Vaginal Delivery   25y.o. yo G2P1001 at 24w1das admitted to the hospital 09/08/2020 for induction of labor.  Indication for induction: Postdates.  Patient had an uncomplicated labor course as follows: Membrane Rupture Time/Date: 7:53 PM ,09/08/2020   Delivery Method:Vaginal, Spontaneous  Episiotomy: None  Lacerations:  1st degree  Details of delivery can be found in separate delivery note.  Patient had a routine postpartum course. Patient is discharged home 09/10/20.  Newborn Data: Birth date:09/09/2020  Birth time:1:45 AM  Gender:Female  Living status:Living  Apgars:8 ,9  Weight:3844 g   Magnesium Sulfate received: No BMZ received: No Rhophylac:N/A MMR:N/A T-DaP:Given prenatally Flu: N/A Transfusion:No  Physical exam  Vitals:   09/09/20 1310 09/09/20 1610 09/09/20 2206 09/10/20 0524  BP: 107/84 115/65 119/74 (!) 98/58  Pulse: 66 68 63 65  Resp: '20 20 18 16  ' Temp:  98.1 F (36.7 C) 98.1 F (36.7 C) 98.3 F (36.8 C)  TempSrc: Oral Oral Oral Oral  SpO2: 100% 100% 100% 100%  Weight:      Height:       General: alert, cooperative, and no distress Lochia: appropriate Uterine Fundus:  firm Incision: N/A DVT Evaluation: No evidence of DVT seen on physical exam. Labs: Lab Results  Component Value Date   WBC 10.3 09/08/2020   HGB 12.6 09/08/2020   HCT 37.9 09/08/2020   MCV 94.8 09/08/2020   PLT 308 09/08/2020   CMP Latest Ref Rng & Units 06/29/2018  Glucose 70 - 99 mg/dL 139(H)  BUN 6 - 20 mg/dL 17  Creatinine 0.44 - 1.00 mg/dL 0.62  Sodium 135 - 145 mmol/L 137  Potassium 3.5 - 5.1 mmol/L 3.3(L)  Chloride 98 - 111 mmol/L 106  CO2 22 - 32 mmol/L 20(L)  Calcium 8.9 - 10.3 mg/dL 9.3  Total Protein 6.5 - 8.1 g/dL 8.5(H)  Total Bilirubin 0.3 - 1.2 mg/dL 0.3  Alkaline Phos 38 - 126 U/L 112  AST 15 - 41 U/L 34  ALT 0 - 44 U/L 36   Edinburgh Score: Edinburgh Postnatal Depression Scale Screening Tool 09/09/2020  I have been able to laugh and see the funny side of things. 0  I have looked forward with enjoyment to things. 0  I have blamed myself unnecessarily when things went wrong. 0  I have been anxious or worried for no good reason. 0  I have felt scared or panicky for no good reason. 0  Things have been getting on top of me. 0  I have been so unhappy that I have had difficulty sleeping. 0  I  have felt sad or miserable. 0  I have been so unhappy that I have been crying. 0  The thought of harming myself has occurred to me. 0  Edinburgh Postnatal Depression Scale Total 0     After visit meds:  Allergies as of 09/10/2020   No Known Allergies      Medication List     STOP taking these medications    pantoprazole 20 MG tablet Commonly known as: Protonix       TAKE these medications    acetaminophen 325 MG tablet Commonly known as: Tylenol Take 2 tablets (650 mg total) by mouth every 4 (four) hours as needed for moderate pain or mild pain.   ferrous sulfate 325 (65 FE) MG tablet Commonly known as: FerrouSul Take 1 tablet (325 mg total) by mouth every other day for 30 doses.   ibuprofen 600 MG tablet Commonly known as: ADVIL Take 1 tablet (600 mg  total) by mouth every 6 (six) hours.   PrePLUS 27-1 MG Tabs Take 1 tablet by mouth daily.               Discharge Care Instructions  (From admission, onward)           Start     Ordered   09/10/20 0000  Discharge wound care:       Comments: As per discharge handout and nursing instructions   09/10/20 1055             Discharge home in stable condition Infant Feeding: Breast Infant Disposition:home with mother Discharge instruction: per After Visit Summary and Postpartum booklet. Activity: Advance as tolerated. Pelvic rest for 6 weeks.  Diet: routine diet Future Appointments:No future appointments. Follow up Visit:  [place] [facility] [place] [facility] .   Specialty: Obstetrics and Gynecology Contact information: [place] [address] [phone]               Message sent to [facility] by Dr. Cy Blamer on 09/09/2020  Please schedule this patient for a In person postpartum visit in 4 weeks with the following provider: Any provider. Additional Postpartum F/U: None   Low risk pregnancy complicated by:  None Delivery mode:  Vaginal, Spontaneous  Anticipated Birth Control:   declined   09/10/2020 [name], DO

## 2020-09-09 NOTE — Progress Notes (Signed)
POSTPARTUM PROGRESS NOTE  Subjective: Tamara Lowery is a 25 y.o. G2P1001 s/p NSVD at [redacted]w[redacted]d.  She reports she doing well. No acute events overnight. She denies any problems with ambulating, voiding or po intake. Denies nausea or vomiting. She has  passed flatus. Pain is well controlled.  Lochia is less than a period.  Objective: Blood pressure 107/84, pulse 66, temperature 98.2 F (36.8 C), temperature source Oral, resp. rate 20, height 5\' 7"  (1.702 m), weight 77.7 kg, last menstrual period 10/27/2019, SpO2 100 %, unknown if currently breastfeeding.  Physical Exam:  General: alert, cooperative and no distress Chest: no respiratory distress Abdomen: soft, non-tender  Uterine Fundus: firm and at level of umbilicus Extremities: No calf swelling or tenderness  no edema, right great toe wrapped- when unwrapped nail with some bloody discharge, no obvious fracture noted  Recent Labs    09/08/20 1030  HGB 12.6  HCT 37.9    Assessment/Plan: Tamara Lowery is a 25 y.o. G2P1001 s/p NSVD at [redacted]w[redacted]d.  Routine Postpartum Care: Doing well, pain well-controlled.  -- Continue routine care, lactation support  -- Contraception: declines -- Feeding: breast and bottle  Right Toe Pain - triaged by ER, change bandage today - X-ray ordered to rule out fracture due to persistent worsening pain - monitor for signs of infection  Dispo: Plan for discharge tomorrow.  [redacted]w[redacted]d, MD Faculty Practice, [facility] for [facility] 09/09/2020 2:53 PM

## 2020-09-09 NOTE — Anesthesia Preprocedure Evaluation (Deleted)
Anesthesia Evaluation    Airway        Dental   Pulmonary former smoker,           Cardiovascular      Neuro/Psych    GI/Hepatic   Endo/Other  diabetes  Renal/GU      Musculoskeletal   Abdominal   Peds  Hematology   Anesthesia Other Findings   Reproductive/Obstetrics                           Anesthesia Physical Anesthesia Plan  ASA:   Anesthesia Plan:    Post-op Pain Management:    Induction:   PONV Risk Score and Plan:   Airway Management Planned:   Additional Equipment:   Intra-op Plan:   Post-operative Plan:   Informed Consent:   Plan Discussed with:   Anesthesia Plan Comments: (Pt delivered before epidural could be placed)      Anesthesia Quick Evaluation

## 2020-09-10 ENCOUNTER — Encounter (HOSPITAL_COMMUNITY): Payer: Self-pay | Admitting: Family Medicine

## 2020-09-10 ENCOUNTER — Other Ambulatory Visit (HOSPITAL_COMMUNITY): Payer: Self-pay

## 2020-09-10 MED ORDER — ACETAMINOPHEN 325 MG PO TABS
650.0000 mg | ORAL_TABLET | ORAL | 0 refills | Status: AC | PRN
  Filled 2020-09-10: qty 90, 8d supply, fill #0

## 2020-09-10 MED ORDER — IBUPROFEN 600 MG PO TABS
600.0000 mg | ORAL_TABLET | Freq: Four times a day (QID) | ORAL | 0 refills | Status: AC
  Filled 2020-09-10: qty 90, 23d supply, fill #0

## 2020-09-10 MED ORDER — IBUPROFEN 600 MG PO TABS: 600.0000 mg | ORAL_TABLET | Freq: Four times a day (QID) | ORAL | 0 refills | Status: DC

## 2020-09-10 MED ORDER — ACETAMINOPHEN 325 MG PO TABS
650.0000 mg | ORAL_TABLET | ORAL | 0 refills | Status: DC | PRN
Start: 2020-09-10 — End: 2020-09-10

## 2020-09-11 LAB — SURGICAL PATHOLOGY

## 2020-09-19 ENCOUNTER — Telehealth (HOSPITAL_COMMUNITY): Payer: Self-pay | Admitting: *Deleted

## 2020-09-19 NOTE — Telephone Encounter (Signed)
Mom reports feeling good. No concerns about herself. EPDS =0 (Hospital score = 0) Mom reports baby is well. Formula feeding without difficulty. Peeing and pooping lots. Sleeps in crib on back. No concerns about baby.  Duffy Rhody, RN 09-19-2020 at 3:05pm

## 2020-10-29 ENCOUNTER — Ambulatory Visit (INDEPENDENT_AMBULATORY_CARE_PROVIDER_SITE_OTHER): Payer: Medicaid Other | Admitting: Advanced Practice Midwife

## 2020-10-29 ENCOUNTER — Encounter: Payer: Self-pay | Admitting: Family Medicine

## 2020-10-29 ENCOUNTER — Other Ambulatory Visit: Payer: Self-pay

## 2020-10-29 VITALS — BP 123/67 | HR 84 | Ht 67.0 in | Wt 235.0 lb

## 2020-10-29 DIAGNOSIS — M545 Low back pain, unspecified: Secondary | ICD-10-CM | POA: Diagnosis not present

## 2020-10-29 DIAGNOSIS — N393 Stress incontinence (female) (male): Secondary | ICD-10-CM | POA: Diagnosis not present

## 2020-10-29 DIAGNOSIS — Z23 Encounter for immunization: Secondary | ICD-10-CM

## 2020-10-29 DIAGNOSIS — Z8619 Personal history of other infectious and parasitic diseases: Secondary | ICD-10-CM | POA: Insufficient documentation

## 2020-10-29 DIAGNOSIS — Z124 Encounter for screening for malignant neoplasm of cervix: Secondary | ICD-10-CM

## 2020-10-29 NOTE — Progress Notes (Signed)
Post Partum Visit Note  Tamara Lowery is a 25 y.o. G52P1001 female who presents for a postpartum visit. She is 7 weeks postpartum following a normal spontaneous vaginal delivery.  I have fully reviewed the prenatal and intrapartum course. The delivery was at [redacted]W[redacted]D gestational weeks.  Anesthesia: none. Postpartum course has been normal. Baby is doing well. Baby is feeding by bottle - Octavia Heir . Bleeding no bleeding. Bowel function is normal. Bladder function is normal. Patient is not sexually active. Contraception method is none. Postpartum depression screening: negative.   The pregnancy intention screening data noted above was reviewed. Potential methods of contraception were discussed. The patient elected to proceed with No data recorded.   Edinburgh Postnatal Depression Scale - 10/29/20 1334       Edinburgh Postnatal Depression Scale:  In the Past 7 Days   I have been able to laugh and see the funny side of things. 0    I have looked forward with enjoyment to things. 0    I have blamed myself unnecessarily when things went wrong. 0    I have been anxious or worried for no good reason. 0    I have felt scared or panicky for no good reason. 0    Things have been getting on top of me. 0    I have been so unhappy that I have had difficulty sleeping. 0    I have felt sad or miserable. 0    I have been so unhappy that I have been crying. 0    The thought of harming myself has occurred to me. 0    Edinburgh Postnatal Depression Scale Total 0             Health Maintenance Due  Topic Date Due   COVID-19 Vaccine (1) Never done   HPV VACCINES (1 - 2-dose series) Never done   PAP-Cervical Cytology Screening  Never done   PAP SMEAR-Modifier  Never done    The following portions of the patient's history were reviewed and updated as appropriate: allergies, current medications, past family history, past medical history, past social history, past surgical history, and  problem list.  Review of Systems Pertinent items noted in HPI and remainder of comprehensive ROS otherwise negative.  Objective:  BP 123/67   Pulse 84   Ht 5\' 7"  (1.702 m)   Wt 235 lb (106.6 kg)   LMP 10/23/2020 (Approximate)   Breastfeeding No   BMI 36.81 kg/m    VS reviewed, nursing note reviewed,  Constitutional: well developed, well nourished, no distress HEENT: normocephalic CV: normal rate Pulm/chest wall: normal effort Abdomen: soft Neuro: alert and oriented x 3 Skin: warm, dry Psych: affect normal   Assessment:   1. Needs flu shot  - Flu Vaccine QUAD 65mo+IM (Fluarix, Fluzone & Alfiuria Quad PF)  2. Acute midline low back pain without sciatica --Pain since vaginal delivery 7 weeks ago - Ambulatory referral to Physical Therapy  3. Stress incontinence in female --When sneezes or coughs - Ambulatory referral to Physical Therapy  4. Postpartum examination following vaginal delivery --Doing well postpartum overall, see above for referral  5. Encounter for screening for cervical cancer --Pt due for Pap but has baby with her and does not want exam today.  Will schedule visit for Pap at check out.   Plan:   Essential components of care per ACOG recommendations:  1.  Mood and well being: Patient with negative depression screening today. Reviewed local resources for  support.  - Patient tobacco use? No.   - hx of drug use? No.    2. Infant care and feeding:  -Patient currently breastmilk feeding? No.  -Social determinants of health (SDOH) reviewed in EPIC. No   3. Sexuality, contraception and birth spacing --Discussed pt contraceptive plans and reviewed contraceptive methods based on pt preferences and effectiveness.  Pt prefers to discuss today and decide at next visit when she returns for Pap.  Discussed risks of pregnancy, recommend condoms/withdrawal.  No unprotected intercourse 2 weeks before contraceptive visit.     4. Sleep and fatigue -Encouraged  family/partner/community support of 4 hrs of uninterrupted sleep to help with mood and fatigue  5. Physical Recovery  - Discussed patients delivery and complications. She describes her labor as good. - Patient had a Vaginal, no problems at delivery. Patient had a 1st degree laceration. Perineal healing reviewed. Patient expressed understanding - Patient has urinary incontinence? Yes. Discussed role of pelvic floor PT. - Patient is safe to resume physical and sexual activity  6.  Health Maintenance - HM due items addressed Yes - Last pap smear No results found for: DIAGPAP Pap smear not done at today's visit.  -Breast Cancer screening indicated? No.   7. Chronic Disease/Pregnancy Condition follow up: None  - PCP follow up  Sharen Counter, CNM Center for [employer], [facility]

## 2020-10-29 NOTE — Patient Instructions (Signed)
   Try www.bedsider.org for info about birth control.

## 2020-12-03 ENCOUNTER — Ambulatory Visit: Payer: Medicaid Other | Admitting: Nurse Practitioner

## 2021-01-07 ENCOUNTER — Encounter: Payer: Medicaid Other | Attending: Advanced Practice Midwife | Admitting: Physical Therapy

## 2021-01-14 ENCOUNTER — Encounter: Payer: Medicaid Other | Admitting: Physical Therapy

## 2021-01-21 ENCOUNTER — Encounter: Payer: Medicaid Other | Admitting: Physical Therapy

## 2021-01-28 ENCOUNTER — Encounter: Payer: Medicaid Other | Admitting: Physical Therapy
